# Patient Record
Sex: Female | Born: 1965 | Race: White | Hispanic: No | Smoking: Never smoker
Health system: Southern US, Community
[De-identification: ages and names within clinical notes are randomized; demographics above are authoritative.]

## PROBLEM LIST (undated history)

## (undated) DIAGNOSIS — M5416 Radiculopathy, lumbar region: Secondary | ICD-10-CM

## (undated) DIAGNOSIS — M545 Low back pain, unspecified: Secondary | ICD-10-CM

## (undated) DIAGNOSIS — G43909 Migraine, unspecified, not intractable, without status migrainosus: Secondary | ICD-10-CM

## (undated) DIAGNOSIS — I7771 Dissection of carotid artery: Secondary | ICD-10-CM

## (undated) DIAGNOSIS — M5412 Radiculopathy, cervical region: Secondary | ICD-10-CM

## (undated) DIAGNOSIS — M542 Cervicalgia: Secondary | ICD-10-CM

## (undated) DIAGNOSIS — I10 Essential (primary) hypertension: Secondary | ICD-10-CM

## (undated) HISTORY — PX: CERVICAL FUSION: SHX112

## (undated) HISTORY — PX: TOTAL ABDOMINAL HYSTERECTOMY: SHX209

## (undated) HISTORY — DX: Essential (primary) hypertension: I10

## (undated) HISTORY — PX: BACK SURGERY: SHX140

## (undated) HISTORY — DX: Dissection of carotid artery: I77.71

---

## 2000-11-30 ENCOUNTER — Encounter: Payer: Self-pay | Admitting: Obstetrics and Gynecology

## 2000-11-30 ENCOUNTER — Ambulatory Visit (HOSPITAL_COMMUNITY): Admission: RE | Admit: 2000-11-30 | Discharge: 2000-11-30 | Payer: Self-pay | Admitting: Obstetrics and Gynecology

## 2001-01-14 ENCOUNTER — Inpatient Hospital Stay (HOSPITAL_COMMUNITY): Admission: RE | Admit: 2001-01-14 | Discharge: 2001-01-16 | Payer: Self-pay | Admitting: Obstetrics and Gynecology

## 2001-01-14 ENCOUNTER — Encounter (INDEPENDENT_AMBULATORY_CARE_PROVIDER_SITE_OTHER): Payer: Self-pay

## 2001-06-02 ENCOUNTER — Ambulatory Visit (HOSPITAL_COMMUNITY): Admission: RE | Admit: 2001-06-02 | Discharge: 2001-06-02 | Payer: Self-pay | Admitting: Unknown Physician Specialty

## 2001-06-02 ENCOUNTER — Encounter: Payer: Self-pay | Admitting: Unknown Physician Specialty

## 2002-10-28 ENCOUNTER — Encounter: Payer: Self-pay | Admitting: Unknown Physician Specialty

## 2002-10-28 ENCOUNTER — Ambulatory Visit (HOSPITAL_COMMUNITY): Admission: RE | Admit: 2002-10-28 | Discharge: 2002-10-28 | Payer: Self-pay | Admitting: Unknown Physician Specialty

## 2003-10-16 ENCOUNTER — Ambulatory Visit (HOSPITAL_COMMUNITY): Admission: RE | Admit: 2003-10-16 | Discharge: 2003-10-16 | Payer: Self-pay | Admitting: Unknown Physician Specialty

## 2003-11-20 ENCOUNTER — Ambulatory Visit (HOSPITAL_COMMUNITY): Admission: RE | Admit: 2003-11-20 | Discharge: 2003-11-20 | Payer: Self-pay | Admitting: Unknown Physician Specialty

## 2003-12-29 ENCOUNTER — Ambulatory Visit (HOSPITAL_COMMUNITY): Admission: RE | Admit: 2003-12-29 | Discharge: 2003-12-30 | Payer: Self-pay | Admitting: Neurosurgery

## 2007-07-19 ENCOUNTER — Ambulatory Visit (HOSPITAL_COMMUNITY): Admission: AD | Admit: 2007-07-19 | Discharge: 2007-07-20 | Payer: Self-pay | Admitting: Neurological Surgery

## 2007-07-24 ENCOUNTER — Emergency Department (HOSPITAL_COMMUNITY): Admission: EM | Admit: 2007-07-24 | Discharge: 2007-07-24 | Payer: Self-pay | Admitting: Emergency Medicine

## 2011-01-24 NOTE — H&P (Signed)
Dawn, Aguilar           ACCOUNT NO.:  [id number]   MEDICAL RECORD NO.:  [id number]          PATIENT TYPE:  AMB   LOCATION:  SDS                          FACILITY:  [facility]   PHYSICIAN:  Stefani Dama, M.D.  DATE OF BIRTH:  06/05/1966   DATE OF ADMISSION:  07/19/2007  DATE OF DISCHARGE:                              HISTORY & PHYSICAL   ADMITTING DIAGNOSIS:  Herniated nucleus pulposus, L3-L4, extraforaminal,  left, with left L3 radiculopathy.   HISTORY OF PRESENT ILLNESS:  [name] is a 45 year old right-  handed white female who has had previous disk disease and has had  diskectomy in 1996 for a large extruded fragment disk at L5-S1 on the  left side.  She has also had cervical disk disease at C5-6 and C6-7 and  underwent surgical decompression in 2005.  Her general health otherwise  has been well, but for the past 3 weeks' time, she started to develop  some back pain and then this past week, she developed severe and  unrelenting left lower extremity pain radiating from the buttock into  the anterior side.  She has not been able to get comfortable and she was  started on some oral steroids about 3 days ago and this only seems to  have not made pain any better; things were worse.  She had an MRI last  night and this demonstrates the presence of an extraforaminal fragment  of disk under the L3 nerve root and the foramen on the left side at L3-  L4.  She is now being admitted to undergo surgical extirpation of the  disk, as she has been in severe pain.   PAST MEDICAL HISTORY:  Her general health has been good.  She reports no  medical problems.  The only other surgery has been hysterectomy.  Her  systems review is notable for the back pain and the leg pain.  On a 14-  point review sheet, no other significant findings are noted.   SOCIAL HISTORY:  She is working.  She functions independently.   FAMILY HISTORY:  Her mother and father are in good health.   PERSONAL  HISTORY:  She does not smoke.  She does not use alcohol.  Height and weight have been stable, 5 feet 110 pounds.   PHYSICAL EXAMINATION:  GENERAL:  She is alert, oriented and cooperative  individual in no overt distress.  BACK:  Range of motion of her back reveals that she can flex forward  only 30 degrees.  She extends 10 degrees.  She has severe unrelenting  pain to palpation and percussion in her back and in the buttock on that  left side.  Motor strength is good in the iliopsoas, quad, tibialis  anterior and the gastrocs.  Deep tendon reflexes are 2+ in both patellae  and 2+ in the Achilles.  Babinski's are downgoing.  Straight leg raising  is markedly positive on the left side at 15 degrees, negative on the  right side at 60 degrees.  Patrick's maneuver is negative bilaterally.  Cranial nerve examination reveals that the pupils are 3 mm and briskly  reactive to light and accommodation.  Extraocular movements are full and  the face is symmetric to grimace.  Tongue and uvula are in the midline.  Sclerae and conjunctivae are clear.  LUNGS:  Clear to auscultation.  HEART:  Regular rate and rhythm.  No murmurs are heard.  ABDOMEN:  Soft.  Bowel sounds are positive.  No masses are palpable.  EXTREMITIES:  Reveal no cyanosis, clubbing or edema.   IMPRESSION:  The patient has evidence of a herniated nucleus pulposus at  the L3-L4 level in the foramen on the left side.  She is now being  admitted to undergo surgical extirpation.      Stefani Dama, M.D.  Electronically Signed     HJE/MEDQ  D:  07/19/2007  T:  07/21/2007  Job:  [id number]

## 2011-01-24 NOTE — Op Note (Signed)
[name], [name]           ACCOUNT NO.:  [id number]   MEDICAL RECORD NO.:  [id number]          PATIENT TYPE:  OIB   LOCATION:  3172                         FACILITY:  [facility]   PHYSICIAN:  Stefani Dama, M.D.  DATE OF BIRTH:  23-Nov-1965   DATE OF PROCEDURE:  07/19/2007  DATE OF DISCHARGE:                               OPERATIVE REPORT   PREOPERATIVE DIAGNOSIS:  Herniated nucleus pulposus L3-L4 extraforaminal  with left L3 radiculopathy.   POSTOPERATIVE DIAGNOSIS:  Herniated nucleus pulposus L3-L4  extraforaminal with left L3 radiculopathy.   PROCEDURE:  Left L3-L4 METRx microdiscectomy with extraforaminal  exploration, decompression of the nerve root, operating microscope,  microdissection technique.   SURGEON:  Stefani Dama, M.D.   FIRST ASSISTANT:  Hilda Lias, M.D.   ANESTHESIA:  General endotracheal.   INDICATIONS:  [name] is a 45 year old right handed individual  who has had the severe onset of back and left lower extremity pain.  She  was found to have an extruded fragment of disc in the extraforaminal  space at L3-L4 compressing the L3 nerve root.  After careful  consideration of her options, noting that she was refractory to  conservative treatment with strong pain medication, she was advised  regarding surgical decompression of the nerve root.   OPERATIVE PROCEDURE:  The patient was brought to the operating room and  placed on the table in a supine position. After the smooth induction of  general endotracheal anesthesia, she was turned prone, bony prominences  being appropriately padded and protected. The back was prepped with  alcohol and DuraPrep and draped in a sterile fashion and the  fluoroscopic guidance was used to localize the L3-L4 extraforaminal  space on the left side. A K-wire was passed to the laminar arch of L3  and then dissection was carried down with a series of dilators being  passed over the K-wire enlarging this area and  removing soft tissues  from the region of the pars.  A 4 cm deep x 18 mm wide endoscopic  cannula was then fixed to the operating table with a clamp.  The  microscope was draped and brought onto the field over this area.  The  area of the pars was identified and the lateral tissues were resected  using monopolar cautery and then a portion of the pars was resected  using a high speed drill.  The resection was taken down to the supra-  articular process of L4 on the left side.  The space was then dissected  removing the intertransverse ligament.  The underlying fatty tissue was  identified below this and the nerve was identified.  It was noted to  feel tight and splayed out dorsally. Inferior to the nerve, the area was  dissected and it was felt that some of the disc material would be  present in this area.  No disc material could be found.  The dorsal  surface of the nerve was then explored and in the lateral aspect, there  was noted be a significant mass underneath the nerve.  The nerve was  dissected superiorly and some epidural veins  in this area were  cauterized and divided and this allowed for some slight retraction of  the nerve inferiorly and the mass underneath it was found, incised in a  vertical fashion, and underneath it a large disc fragment presented  itself.  This was removed and almost immediately there was good  decompression of the nerve root. Further exploration with a series of  nerve hooks underneath the nerve yielded a second large fragment of disc  material. Beyond this, there was venous bleeding which was tamponaded  with some pledgets of Gelfoam soaked in thrombin, some dilated epidural  veins that presented themselves were cauterized.  Once hemostasis was  achieved, further exploration yielded no other disc fragments.  Exploration of the nerve now yielded that the nerve was well  decompressed in its travel through the foramen. With good decompression  being  obtained and hemostasis being obtained, the wound was irrigated  copiously with antibiotic irrigating solution.  The endoscopic cannula  was removed.  The microscope was removed.  The fascia was closed with a  singular 3-0 Vicryl suture, 3-0 Vicryl was used in the subcuticular  tissues, and Dermabond was placed on the skin.  The patient tolerated  the procedure well and was returned to the recovery room in stable  condition.      Stefani Dama, M.D.  Electronically Signed     HJE/MEDQ  D:  07/19/2007  T:  07/20/2007  Job:  [id number]

## 2011-01-27 NOTE — Discharge Summary (Signed)
[facility]  Patient:    [name], [name]                MRN: [id number] Adm. Date:  [id number] Disc. Date: [id number] Attending:  Lendon Colonel                           Discharge Summary  ADMITTING DIAGNOSIS:  Severe dysmenorrhea and menorrhagia with anemia, adenomyosis.  DISCHARGE DIAGNOSIS:  Severe dysmenorrhea and menorrhagia with anemia, adenomyosis.  OPERATION PERFORMED:  Total abdominal hysterectomy.  HISTORY OF PRESENT ILLNESS:  Ms. Kalbfleisch is a 45 year old para 2 female status post two C sections with heavy painful periods, slightly enlarged symmetrical uterus with an MRI diagnosis of adenomyosis.  Hemoglobin on admission was 10.2, hematocrit was 32.  HOSPITAL COURSE:  She was admitted to the hospital and underwent an uneventful hysterectomy.  Her postoperative course was uncomplicated.  Pathologically, adenomyosis was confirmed by Dr. Vassie Loll.  Her postoperative course was uncomplicated.  She remained afebrile without complaints, is now ready for discharge.  Instructions were given to the patient.  She was asked to return to the office in six weeks.  She will come by the office on Friday to have her clips removed.  CONDITION ON DISCHARGE:  Improved. DD:  01/16/01 TD:  01/16/01 Job: [id number]

## 2011-01-27 NOTE — H&P (Signed)
[facility]  Patient:    [name], [name]                    MRN: [id number] Adm. Date:  01/14/01 Attending:  Katherine Roan, M.D.                         History and Physical  CHIEF COMPLAINT:  Heavy painful periods.  HISTORY OF PRESENT ILLNESS:  [name] is a 45 year old gravida 2, para 2 female who continues to complain of heavy painful periods.  A trial of oral contraceptives did not work and an MRI revealed adenomyosis of the uterus. She is status post two C-sections.  She has had a tubal ligation at the time of her last section and she has had lumbar disk disease.  She currently takes no medications, has no known allergies and received no blood transfusion.  REVIEW OF SYSTEMS:  HEENT:  She wears glasses for reading, with no decrease in visual or auditory acuity.  No dizziness.  No frequent headaches.  HEART:  No history of MVP.  No rheumatic fever.  No chest pain or hypertension.  LUNGS: No chronic cough.  No asthma.  No shortness of breath.  GU:  She denies stress urinary incontinence.  No frequency.  She has no history of UTIs.  GI:  No bowel habit change.  No anorexia.  No weight loss or gain.  MUSCLES, BONES AND JOINTS:  Negative.  No fractures or arthritis.  SOCIAL HISTORY:  She is a Counsellor, works regularly.  FAMILY HISTORY:  Mother and father both living and well.  She has two sisters who are living and well.  She has a paternal aunt who has a malignancy in her brain and a maternal uncle with lesion in his liver.  There is a strong family history of heart disease in a paternal grandfather.  Her mother has mild hypertension, as well as her sister.  Her father has had mini-strokes.  No diabetes in the family.  PHYSICAL EXAMINATION:  VITAL SIGNS:  Examination reveals a weight of 122 pounds, a blood pressure of 120/60.  HEENT:  Examination of ears, nose and throat is unremarkable.  The oropharynx is not injected.  NECK:  Supple.   Thyroid is not enlarged.  Carotid pulses are equal without bruits.  Trachea is in the midline.  No adenopathy appreciated.  BREASTS:  No masses or tenderness.  Axillae free from adenopathy.  LUNGS:  Clear to P&A.  HEART:  Normal sinus rhythm.  No murmurs.  ABDOMEN:  Soft and flat.  Liver, spleen and kidneys are not palpated.  Bowel sounds are normal.  No tenderness noted.  No rebound or guarding.  There is a transverse incision from her previous C-section that is well-healed.  PELVIC:  Examination reveals a normal vulva and vagina.  Cervix is clean. Uterus is anterior, slightly enlarged, with no adnexal masses.  Rectovaginal confirms.  Her Paps are normal.  EXTREMITIES:  Good range of motion, equal pulses and reflexes.  NEUROLOGIC:  Exam revealed cranial nerves are intact.  The patient is oriented to time, space and recent events.  IMPRESSION:  Persistent heavy painful periods despite conservative therapy, MRI evidence of adenomyosis.  PLAN:  Total abdominal hysterectomy.  Risks and benefits including blood loss, bladder/bowel damage and vascular injuries have been discussed with patient. DD:  01/11/01 TD:  01/13/01 Job: [id number]

## 2011-01-27 NOTE — Op Note (Signed)
[facility]  Patient:    [name], [name]                MRN: [id number] Proc. Date: 01/14/01 Adm. Date:  [id number] Attending:  Lendon Colonel                           Operative Report  PREOPERATIVE DIAGNOSES:  Anemia, dysmenorrhea, and menorrhagia unresponsive. Magnetic resonance imaging evidence of adenomyosis.  POSTOPERATIVE DIAGNOSES:  Anemia, dysmenorrhea, and menorrhagia unresponsive. Magnetic resonance imaging evidence of adenomyosis.  OPERATION:  Total abdominal hysterectomy.  DESCRIPTION OF PROCEDURE:  The patient was placed in lithotomy position and prepped and draped in the usual fashion.  The old transverse incision was entered with sharp dissection.  Hemostasis was accomplished with the Bovie. The peritoneum was entered vertically, and exploration of the upper abdomen revealed two normal kidneys.  The liver was smooth, and the gallbladder was soft and compressible.  No stones were felt.  No adenopathy was noted.  There was minimal peritoneal fluid.  Both ovaries were normal.  The uterus was grasped with Kelly clamps, the uteroovarian anastomoses transected bilaterally along with the round ligaments.  Uterine vessels were skeletonized; a bladder flap was created and pushed off the lower segment.  Cardinals and ureterosacral complex were then clamped and ligated and identified for later use.  The angles of the vagina were entered and the specimens removed from the operative field.  Vagina was then closed with horizontal mattress sutures of 0 chromic and figure-of-eight sutures of 0 Vicryl.  The ureterosacral complex and cardinals were then sutured into the vagina with interrupted sutures of 0 Vicryl.  The vaginal vault suspension was satisfactory.  Both ovaries were normal, and the round ligaments were normal.  Reperitonealization was accomplished with 3-0 Vicryl.  Hemostasis was secure.  The pelvis was washed with copious  amounts of saline.  The parietal peritoneum was closed with a 2-0 PDS.  The fascia was closed with a 2-0 PDS and interrupted 0 Vicryl. Hemostasis was secure.  The subcutaneous was irrigated, and then the skin was closed with clips.  The incision was infiltrated with 20 cc of 0.5% Marcaine with epinephrine.  Ms. Michl tolerated the procedure well.  Sent to the recovery room in good condition. DD:  01/14/01 TD:  01/12/01 Job: [id number]

## 2011-01-27 NOTE — H&P (Signed)
NAME:  [name], [name]                     ACCOUNT NO.:  [id number]   MEDICAL RECORD NO.:  [id number]                   PATIENT TYPE:  OIB   LOCATION:  3011                                 FACILITY:  [facility]   PHYSICIAN:  Hilda Lias, M.D.                DATE OF BIRTH:  1965-10-22   DATE OF ADMISSION:  12/29/2003  DATE OF DISCHARGE:                                HISTORY & PHYSICAL   HISTORY OF PRESENT ILLNESS:  Ms. Gatling is a lady who was seen by me  because of neck pain with radiation to the upper extremity.  In the past, in  1996, she underwent L5-S1 diskectomy by me.  This time, she has been  complaining of neck pain since December of 2004 that goes to the left  shoulder, and to the left upper extremity.  The patient, although is able to  work, nevertheless is quite miserable, and she apparently can not sleep.  She also feels that she is getting weak.  Also, she says the right arm is  getting swollen in relation to the left one.  The patient has no problems  with bladder or bowel.   An MRI was obtained.  Because of the findings, she wants to proceed with  surgery.   PAST MEDICAL HISTORY:  Lumbar diskectomy, hysterectomy, cesarean section.   ALLERGIES:  She is not allergic to any medications.   SOCIAL HISTORY:  Negative.   FAMILY HISTORY:  Mother has degenerative joint disease.   REVIEW OF SYSTEMS:  Positive for balance disturbance, weakness and neck  pain.   PHYSICAL EXAMINATION:  GENERAL:  The patient came to my office and was quite  uncomfortable.  She had difficulty with the pain in the right upper  extremity.  HEENT:  Head, nose and throat normal.  NECK:  She is able to flex by extension and lateralization because of  discomfort.  LUNGS:  Clear.  HEART:  Sounds normal.  ABDOMEN:  Normal.  EXTREMITIES:  Normal pulses.  NEUROLOGIC:  Mental status is normal.  Cranial nerves normal.  Strength:  I  can bring both biceps easily, the right triceps is 0/5.  She  has a weakening  of the first extensor.  She has atrophy of the right triceps.  Reflexes are  symmetrical with absence of the right triceps.  She has hypotonia of the  right pectoralis major as well as the right triceps.  Coordination is  normal.  BACK:  The cervical spine shows some spondylosis from C3 down to C5/6.  The  MRI shows she has a severe case of spondylosis of L5-L6, and at the level of  L6-L7, she has a large herniated disk, central and to the right.   CLINICAL IMPRESSION:  C5-C6 spondylosis with a C6-C7 herniated disk.   RECOMMENDATION:  1. The patient is going to proceed with anterior cervical diskectomy,     decompression of the spinal cord at the  level     of C5-6, and C6-7.  2. She knows about the risks of infection, CSF leak, damage to the     __________  , damage to the vertebral artery, damage to the carotid,     failure of the graft, need for further surgery.  __________  .                                                Hilda Lias, M.D.    EB/MEDQ  D:  12/29/2003  T:  12/30/2003  Job:  [id number]

## 2011-01-27 NOTE — Op Note (Signed)
[name], AGREDANO                     ACCOUNT NO.:  [id number]   MEDICAL RECORD NO.:  [id number]                   PATIENT TYPE:  OIB   LOCATION:  3011                                 FACILITY:  [facility]   PHYSICIAN:  Hilda Lias, M.D.                DATE OF BIRTH:  March 20, 1966   DATE OF PROCEDURE:  12/29/2003  DATE OF DISCHARGE:                                 OPERATIVE REPORT   PREOPERATIVE DIAGNOSIS:  C5-C6 and C6-C7 spondylosis with herniated disc and  weakness of the right triceps with atrophy.   POSTOPERATIVE DIAGNOSIS:  C5-C6 and C6-C7 spondylosis with herniated disc  and weakness of the right triceps with atrophy.   PROCEDURE:  C5-C6 and C6-C7 decompression of the spinal cord, bilateral  foraminotomy, decompression of the C6 and C7 nerve roots, intervertebral  fusion with allograft placed from C5 to C7, microscope.   SURGEON:  Hilda Lias, M.D.   ASSISTANT:  Danae Orleans. Venetia Maxon, M.D.   CLINICAL HISTORY:  The patient was admitted because of neck pain with  radiation to both upper extremities, worse on the left, with hypertrophy of  the right triceps.  X-ray showed spondylosis and herniated disc at the level  of C5-C6 and C6-C7.  Surgery was advised.  The risks were explained during  the history and physical.   PROCEDURE:  The patient was taken to the OR and after intubation, the left  side of the neck was prepped with Betadine.  A transverse incision was made  through the skin and platysma down to the cervical spine.  X-ray showed  that, indeed, we were at the level of 5-6.  The anterior ligament was opened  at 5-6 and 6-7.  We brought in the microscope, and we did discectomy at  those levels.  We found at 6-7, the patient has 3-4 fragments of disc going  to the left side.  At the level of 5-6, we found mostly spondylosis with  chronic compression of the C6 nerve root.  Having good decompression, the  endplates were drilled.  The allograft 7 mm in height was  inserted followed  by a plate using six screws.  The lateral C-spine showed good position of  the bone graft, screws, and plate.  From then on, the area was irrigated,  hemostasis was done with bipolar, and the wound was closed with Vicryl and  Steri-Strips.  December 28, 2003                                               Hilda Lias, M.D.    EB/MEDQ  D:  12/29/2003  T:  12/29/2003  Job:  [id number]

## 2011-06-20 LAB — CBC
HCT: 41.4
MCHC: 34.2
Platelets: 258
RDW: 12.1

## 2015-06-17 ENCOUNTER — Encounter (HOSPITAL_COMMUNITY): Payer: Self-pay | Admitting: *Deleted

## 2015-06-17 ENCOUNTER — Emergency Department (HOSPITAL_COMMUNITY): Payer: BLUE CROSS/BLUE SHIELD

## 2015-06-17 ENCOUNTER — Emergency Department (HOSPITAL_COMMUNITY)
Admission: EM | Admit: 2015-06-17 | Discharge: 2015-06-17 | Disposition: A | Discharge status: Home / Self Care | Payer: BLUE CROSS/BLUE SHIELD | Attending: Emergency Medicine | Admitting: Emergency Medicine

## 2015-06-17 DIAGNOSIS — R51 Headache: Secondary | ICD-10-CM | POA: Diagnosis present

## 2015-06-17 DIAGNOSIS — G43909 Migraine, unspecified, not intractable, without status migrainosus: Secondary | ICD-10-CM | POA: Insufficient documentation

## 2015-06-17 DIAGNOSIS — R519 Headache, unspecified: Secondary | ICD-10-CM

## 2015-06-17 DIAGNOSIS — H5702 Anisocoria: Secondary | ICD-10-CM

## 2015-06-17 DIAGNOSIS — H5710 Ocular pain, unspecified eye: Secondary | ICD-10-CM | POA: Diagnosis not present

## 2015-06-17 DIAGNOSIS — H9209 Otalgia, unspecified ear: Secondary | ICD-10-CM | POA: Insufficient documentation

## 2015-06-17 LAB — COMPREHENSIVE METABOLIC PANEL
ALT: 13 U/L — ABNORMAL LOW (ref 14–54)
ANION GAP: 9 (ref 5–15)
AST: 20 U/L (ref 15–41)
Albumin: 4.2 g/dL (ref 3.5–5.0)
Alkaline Phosphatase: 55 U/L (ref 38–126)
BUN: 13 mg/dL (ref 6–20)
CHLORIDE: 103 mmol/L (ref 101–111)
CO2: 24 mmol/L (ref 22–32)
CREATININE: 0.65 mg/dL (ref 0.44–1.00)
Calcium: 9.1 mg/dL (ref 8.9–10.3)
Glucose, Bld: 95 mg/dL (ref 65–99)
POTASSIUM: 4 mmol/L (ref 3.5–5.1)
SODIUM: 136 mmol/L (ref 135–145)
Total Bilirubin: 0.9 mg/dL (ref 0.3–1.2)
Total Protein: 7.2 g/dL (ref 6.5–8.1)

## 2015-06-17 LAB — CBC
HEMATOCRIT: 45.1 % (ref 36.0–46.0)
Hemoglobin: 15.4 g/dL — ABNORMAL HIGH (ref 12.0–15.0)
MCH: 33.3 pg (ref 26.0–34.0)
MCHC: 34.1 g/dL (ref 30.0–36.0)
MCV: 97.6 fL (ref 78.0–100.0)
PLATELETS: 230 10*3/uL (ref 150–400)
RBC: 4.62 MIL/uL (ref 3.87–5.11)
RDW: 11.8 % (ref 11.5–15.5)
WBC: 5.6 10*3/uL (ref 4.0–10.5)

## 2015-06-17 LAB — I-STAT CHEM 8, ED
BUN: 16 mg/dL (ref 6–20)
CALCIUM ION: 1.19 mmol/L (ref 1.12–1.23)
Chloride: 101 mmol/L (ref 101–111)
Creatinine, Ser: 0.7 mg/dL (ref 0.44–1.00)
Glucose, Bld: 94 mg/dL (ref 65–99)
HEMATOCRIT: 48 % — AB (ref 36.0–46.0)
HEMOGLOBIN: 16.3 g/dL — AB (ref 12.0–15.0)
Potassium: 3.9 mmol/L (ref 3.5–5.1)
SODIUM: 138 mmol/L (ref 135–145)
TCO2: 23 mmol/L (ref 0–100)

## 2015-06-17 LAB — DIFFERENTIAL
BASOS PCT: 0 %
Basophils Absolute: 0 10*3/uL (ref 0.0–0.1)
EOS ABS: 0.1 10*3/uL (ref 0.0–0.7)
Eosinophils Relative: 2 %
Lymphocytes Relative: 25 %
Lymphs Abs: 1.4 10*3/uL (ref 0.7–4.0)
MONO ABS: 0.4 10*3/uL (ref 0.1–1.0)
MONOS PCT: 8 %
NEUTROS ABS: 3.7 10*3/uL (ref 1.7–7.7)
Neutrophils Relative %: 65 %

## 2015-06-17 LAB — CBG MONITORING, ED: GLUCOSE-CAPILLARY: 88 mg/dL (ref 65–99)

## 2015-06-17 LAB — I-STAT TROPONIN, ED: TROPONIN I, POC: 0.01 ng/mL (ref 0.00–0.08)

## 2015-06-17 LAB — APTT: APTT: 30 s (ref 24–37)

## 2015-06-17 LAB — PROTIME-INR
INR: 1.17 (ref 0.00–1.49)
PROTHROMBIN TIME: 15 s (ref 11.6–15.2)

## 2015-06-17 MED ORDER — TETRACAINE HCL 0.5 % OP SOLN
2.0000 [drp] | Freq: Once | OPHTHALMIC | Status: AC
  Administered 2015-06-17: 2 [drp] via OPHTHALMIC
  Filled 2015-06-17: qty 2

## 2015-06-17 MED ORDER — DIPHENHYDRAMINE HCL 50 MG/ML IJ SOLN
25.0000 mg | Freq: Once | INTRAMUSCULAR | Status: AC
  Administered 2015-06-17: 25 mg via INTRAVENOUS
  Filled 2015-06-17: qty 1

## 2015-06-17 MED ORDER — METOCLOPRAMIDE HCL 5 MG/ML IJ SOLN
10.0000 mg | Freq: Once | INTRAMUSCULAR | Status: AC
  Administered 2015-06-17: 10 mg via INTRAVENOUS
  Filled 2015-06-17: qty 2

## 2015-06-17 NOTE — ED Notes (Signed)
PA to see and assess patient before RN assessment. 

## 2015-06-17 NOTE — ED Notes (Signed)
CHECKED CBG 88 RN SABRINA INFORMED

## 2015-06-17 NOTE — ED Notes (Signed)
MD at bedside. 

## 2015-06-17 NOTE — ED Provider Notes (Signed)
CSN: [id number]     Arrival date & time 06/17/15  1712 History   First MD Initiated Contact with Patient 06/17/15 1809     Chief Complaint  Patient presents with  . Stroke Symptoms  . Headache  . Jaw Pain     (Consider location/radiation/quality/duration/timing/severity/associated sxs/prior Treatment) Patient is a 49 y.o. female presenting with headaches.  Headache Pain location:  L parietal (L eye) Quality:  Dull Radiates to:  Does not radiate Severity currently:  Unable to specify Duration:  2 days Timing:  Constant Progression:  Unchanged Chronicity:  New Similar to prior headaches: no   Context: not activity and not exposure to bright light   Relieved by:  Nothing Worsened by:  Activity and light Associated symptoms: ear pain, eye pain and sinus pressure   Associated symptoms: no abdominal pain, no nausea and no vomiting     History reviewed. No pertinent past medical history. Past Surgical History  Procedure Laterality Date  . Back surgery    . Cervical fusion     No family history on file. Social History  Substance Use Topics  . Smoking status: Never Smoker   . Smokeless tobacco: None  . Alcohol Use: No   OB History    No data available     Review of Systems  HENT: Positive for ear pain and sinus pressure.   Eyes: Positive for pain.  Gastrointestinal: Negative for nausea, vomiting and abdominal pain.  Neurological: Positive for headaches.  All other systems reviewed and are negative.     Allergies  Review of patient's allergies indicates no known allergies.  Home Medications   Prior to Admission medications   Medication Sig Start Date End Date Taking? Authorizing Provider  ibuprofen (ADVIL,MOTRIN) 200 MG tablet Take 200 mg by mouth every 6 (six) hours as needed for moderate pain.   Yes Historical Provider, MD  SUMAtriptan (IMITREX) 50 MG tablet Take 50 mg by mouth every 2 (two) hours as needed for migraine. May repeat in 2 hours if headache  persists or recurs.   Yes Historical Provider, MD   BP 111/75 mmHg  Pulse 94  Temp(Src) 98.1 F (36.7 C) (Oral)  Resp 16  SpO2 99% Physical Exam  Constitutional: She is oriented to person, place, and time. She appears well-developed and well-nourished.  HENT:  Head: Normocephalic and atraumatic.  Right Ear: External ear normal.  Left Ear: External ear normal.  Eyes: Conjunctivae and EOM are normal. Pupils are equal, round, and reactive to light.  Neck: Normal range of motion. Neck supple.  Cardiovascular: Normal rate, regular rhythm, normal heart sounds and intact distal pulses.   Pulmonary/Chest: Effort normal and breath sounds normal.  Abdominal: Soft. Bowel sounds are normal. There is no tenderness.  Musculoskeletal: Normal range of motion.  Neurological: She is alert and oriented to person, place, and time. She has normal strength. No sensory deficit.  Prominent anisocoria, R pupil >L, bil reactive  Skin: Skin is warm and dry.  Vitals reviewed.   ED Course  Procedures (including critical care time) Labs Review Labs Reviewed  CBC - Abnormal; Notable for the following:    Hemoglobin 15.4 (*)    All other components within normal limits  COMPREHENSIVE METABOLIC PANEL - Abnormal; Notable for the following:    ALT 13 (*)    All other components within normal limits  I-STAT CHEM 8, ED - Abnormal; Notable for the following:    Hemoglobin 16.3 (*)    HCT 48.0 (*)  All other components within normal limits  PROTIME-INR  APTT  DIFFERENTIAL  I-STAT TROPOININ, ED  CBG MONITORING, ED    Imaging Review Ct Head Wo Contrast  06/17/2015   CLINICAL DATA:  Left-sided headache, numbness  EXAM: CT HEAD WITHOUT CONTRAST  TECHNIQUE: Contiguous axial images were obtained from the base of the skull through the vertex without intravenous contrast.  COMPARISON:  None.  FINDINGS: There is no evidence of mass effect, midline shift or extra-axial fluid collections. There is no evidence of a  space-occupying lesion or intracranial hemorrhage. There is no evidence of a cortical-based area of acute infarction. Mild periventricular white matter low attenuation likely secondary to microvascular disease.  The ventricles and sulci are appropriate for the patient's age. The basal cisterns are patent.  Visualized portions of the orbits are unremarkable. The visualized portions of the paranasal sinuses and mastoid air cells are unremarkable.  The osseous structures are unremarkable.  IMPRESSION: No acute intracranial pathology.   Electronically Signed   By: Elige Ko   On: 06/17/2015 19:11   Mr Angiogram Head Wo Contrast  06/17/2015   CLINICAL DATA:  Anisocoria.  Headache.  EXAM: MRI HEAD WITHOUT CONTRAST  MRA HEAD WITHOUT CONTRAST  TECHNIQUE: Multiplanar, multiecho pulse sequences of the brain and surrounding structures were obtained without intravenous contrast. Angiographic images of the head were obtained using MRA technique without contrast.  COMPARISON:  CT head 06/17/2015  FINDINGS: MRI HEAD FINDINGS  Ventricle size is normal.  Cerebral volume is normal.  Negative for acute infarct. Minimal chronic microvascular ischemic changes in the white matter.  Negative for hemorrhage or mass  Pituitary normal in size. Cervical medullary junction is normal. Normal orbit. Paranasal sinuses are clear.  MRA HEAD FINDINGS  Both vertebral arteries patent to the basilar. PICA patent bilaterally. Basilar widely patent. Superior cerebellar and posterior cerebral arteries are normal  Internal carotid artery is normal bilaterally. Anterior and middle cerebral arteries are normal bilaterally. No significant cranial stenosis  Negative for cerebral aneurysm.  IMPRESSION: No acute intracranial abnormality.  Normal for age.  Negative MRA head.   Electronically Signed   By: Marlan Palau M.D.   On: 06/17/2015 20:44   Mr Brain Wo Contrast  06/17/2015   CLINICAL DATA:  Anisocoria.  Headache.  EXAM: MRI HEAD WITHOUT CONTRAST   MRA HEAD WITHOUT CONTRAST  TECHNIQUE: Multiplanar, multiecho pulse sequences of the brain and surrounding structures were obtained without intravenous contrast. Angiographic images of the head were obtained using MRA technique without contrast.  COMPARISON:  CT head 06/17/2015  FINDINGS: MRI HEAD FINDINGS  Ventricle size is normal.  Cerebral volume is normal.  Negative for acute infarct. Minimal chronic microvascular ischemic changes in the white matter.  Negative for hemorrhage or mass  Pituitary normal in size. Cervical medullary junction is normal. Normal orbit. Paranasal sinuses are clear.  MRA HEAD FINDINGS  Both vertebral arteries patent to the basilar. PICA patent bilaterally. Basilar widely patent. Superior cerebellar and posterior cerebral arteries are normal  Internal carotid artery is normal bilaterally. Anterior and middle cerebral arteries are normal bilaterally. No significant cranial stenosis  Negative for cerebral aneurysm.  IMPRESSION: No acute intracranial abnormality.  Normal for age.  Negative MRA head.   Electronically Signed   By: Marlan Palau M.D.   On: 06/17/2015 20:44   I have personally reviewed and evaluated these images and lab results as part of my medical decision-making.   EKG Interpretation   Date/Time:  Thursday June 17 2015  17:43:52 EDT Ventricular Rate:  103 PR Interval:  134 QRS Duration: 70 QT Interval:  346 QTC Calculation: 453 R Axis:   59 Text Interpretation:  Sinus tachycardia Otherwise normal ECG No old  tracing to compare Confirmed by Mirian Mo [phone]) on 06/17/2015  6:12:28 PM      MDM   Final diagnoses:  Anisocoria  Acute nonintractable headache, unspecified headache type    49 y.o. female with pertinent PMH of migraine presents with ha as above.  Normally the patient has headaches on the right side, however states this one started in the left side approximately 2 days ago. She does have intermittent problems with blurred vision,  however states this is worse in the left. She states that she does not know if her pupils are mineral regular before, however family member at bedside is never noticed this. On arrival vital signs and physical exam as above. Patient has anisocoria, however both pupils are reactive.  States she has a ho identical prior event.    Dorna Bloom today unremarkable, and I spoke with neurology who recommended MRA.  This was also unremarkable.  IOP 15 OD, 13 OS.  No visual problems currently.  DC home to fu with ophthalmology and neuro.  I have reviewed all laboratory and imaging studies if ordered as above  1. Anisocoria   2. Acute nonintractable headache, unspecified headache type         Mirian Mo, MD 06/17/15 2321

## 2015-06-17 NOTE — Discharge Instructions (Signed)

## 2015-06-17 NOTE — ED Notes (Signed)
Pt states started having left eye watering on Tuesday and took her imitrex for migraines where she was having pain on left side posterior head.  Pt usually has them on her right side. Pt had some eye dropping.  Pt is having a "throbbing to left eye".  Pt states headache since Tuesday continuous.  Pt has irregular pupils, not sure if normal for her.  Pt has a "swelling" pain to left ear. No facial or extremity deficits.  Pt states feels like vision was different today, blurry.

## 2015-06-21 ENCOUNTER — Other Ambulatory Visit: Payer: Self-pay | Admitting: Ophthalmology

## 2015-06-21 DIAGNOSIS — G902 Horner's syndrome: Secondary | ICD-10-CM

## 2015-06-25 ENCOUNTER — Ambulatory Visit
Admission: RE | Admit: 2015-06-25 | Discharge: 2015-06-25 | Disposition: A | Discharge status: Home / Self Care | Payer: BLUE CROSS/BLUE SHIELD | Source: Ambulatory Visit | Attending: Ophthalmology | Admitting: Ophthalmology

## 2015-06-25 ENCOUNTER — Inpatient Hospital Stay (HOSPITAL_COMMUNITY)
Admission: EM | Admit: 2015-06-25 | Discharge: 2015-06-27 | DRG: 301 | Disposition: A | Discharge status: Home / Self Care | Payer: BLUE CROSS/BLUE SHIELD | Attending: Family Medicine | Admitting: Family Medicine

## 2015-06-25 ENCOUNTER — Encounter (HOSPITAL_COMMUNITY): Payer: Self-pay | Admitting: Emergency Medicine

## 2015-06-25 DIAGNOSIS — G902 Horner's syndrome: Secondary | ICD-10-CM

## 2015-06-25 DIAGNOSIS — I7771 Dissection of carotid artery: Secondary | ICD-10-CM | POA: Diagnosis not present

## 2015-06-25 DIAGNOSIS — R51 Headache: Secondary | ICD-10-CM | POA: Diagnosis not present

## 2015-06-25 DIAGNOSIS — I10 Essential (primary) hypertension: Secondary | ICD-10-CM | POA: Diagnosis not present

## 2015-06-25 DIAGNOSIS — Z823 Family history of stroke: Secondary | ICD-10-CM

## 2015-06-25 DIAGNOSIS — Z8679 Personal history of other diseases of the circulatory system: Secondary | ICD-10-CM | POA: Diagnosis present

## 2015-06-25 DIAGNOSIS — G43909 Migraine, unspecified, not intractable, without status migrainosus: Secondary | ICD-10-CM | POA: Insufficient documentation

## 2015-06-25 DIAGNOSIS — Z8249 Family history of ischemic heart disease and other diseases of the circulatory system: Secondary | ICD-10-CM

## 2015-06-25 HISTORY — DX: Low back pain: M54.5

## 2015-06-25 HISTORY — DX: Cervicalgia: M54.2

## 2015-06-25 HISTORY — DX: Low back pain, unspecified: M54.50

## 2015-06-25 HISTORY — DX: Radiculopathy, lumbar region: M54.16

## 2015-06-25 HISTORY — DX: Radiculopathy, cervical region: M54.12

## 2015-06-25 HISTORY — DX: Migraine, unspecified, not intractable, without status migrainosus: G43.909

## 2015-06-25 LAB — PROTIME-INR
INR: 1.32 (ref 0.00–1.49)
Prothrombin Time: 16.5 seconds — ABNORMAL HIGH (ref 11.6–15.2)

## 2015-06-25 LAB — CBC WITH DIFFERENTIAL/PLATELET
BASOS PCT: 0 %
Basophils Absolute: 0 10*3/uL (ref 0.0–0.1)
Eosinophils Absolute: 0 10*3/uL (ref 0.0–0.7)
Eosinophils Relative: 0 %
HEMATOCRIT: 42.2 % (ref 36.0–46.0)
HEMOGLOBIN: 14.8 g/dL (ref 12.0–15.0)
LYMPHS ABS: 1 10*3/uL (ref 0.7–4.0)
LYMPHS PCT: 14 %
MCH: 33.9 pg (ref 26.0–34.0)
MCHC: 35.1 g/dL (ref 30.0–36.0)
MCV: 96.6 fL (ref 78.0–100.0)
MONOS PCT: 2 %
Monocytes Absolute: 0.2 10*3/uL (ref 0.1–1.0)
NEUTROS ABS: 5.9 10*3/uL (ref 1.7–7.7)
NEUTROS PCT: 84 %
Platelets: 255 10*3/uL (ref 150–400)
RBC: 4.37 MIL/uL (ref 3.87–5.11)
RDW: 11.7 % (ref 11.5–15.5)
WBC: 7.1 10*3/uL (ref 4.0–10.5)

## 2015-06-25 LAB — BASIC METABOLIC PANEL
ANION GAP: 8 (ref 5–15)
BUN: 18 mg/dL (ref 6–20)
CHLORIDE: 104 mmol/L (ref 101–111)
CO2: 24 mmol/L (ref 22–32)
Calcium: 9 mg/dL (ref 8.9–10.3)
Creatinine, Ser: 0.6 mg/dL (ref 0.44–1.00)
Glucose, Bld: 128 mg/dL — ABNORMAL HIGH (ref 65–99)
POTASSIUM: 3.8 mmol/L (ref 3.5–5.1)
SODIUM: 136 mmol/L (ref 135–145)

## 2015-06-25 MED ORDER — ACETAMINOPHEN 325 MG PO TABS: 650.0000 mg | ORAL_TABLET | Freq: Four times a day (QID) | ORAL | Status: DC | PRN

## 2015-06-25 MED ORDER — ALUM & MAG HYDROXIDE-SIMETH 200-200-20 MG/5ML PO SUSP: 30.0000 mL | Freq: Four times a day (QID) | ORAL | Status: DC | PRN

## 2015-06-25 MED ORDER — HEPARIN BOLUS VIA INFUSION
1000.0000 [IU] | Freq: Once | INTRAVENOUS | Status: AC
  Administered 2015-06-25: 1000 [IU] via INTRAVENOUS

## 2015-06-25 MED ORDER — ASPIRIN EC 325 MG PO TBEC
325.0000 mg | DELAYED_RELEASE_TABLET | Freq: Every day | ORAL | Status: DC
  Administered 2015-06-26: 325 mg via ORAL
  Filled 2015-06-25 (×2): qty 1

## 2015-06-25 MED ORDER — LISINOPRIL 10 MG PO TABS: 10.0000 mg | ORAL_TABLET | Freq: Every day | ORAL | Status: DC

## 2015-06-25 MED ORDER — IBUPROFEN 400 MG PO TABS: 200.0000 mg | ORAL_TABLET | Freq: Four times a day (QID) | ORAL | Status: DC | PRN

## 2015-06-25 MED ORDER — HEPARIN (PORCINE) IN NACL 100-0.45 UNIT/ML-% IJ SOLN
900.0000 [IU]/h | INTRAMUSCULAR | Status: DC
  Administered 2015-06-25: 750 [IU]/h via INTRAVENOUS
  Filled 2015-06-25: qty 250

## 2015-06-25 MED ORDER — LISINOPRIL 10 MG PO TABS
10.0000 mg | ORAL_TABLET | Freq: Every day | ORAL | Status: DC
  Administered 2015-06-26 – 2015-06-27 (×3): 10 mg via ORAL
  Filled 2015-06-25 (×3): qty 1

## 2015-06-25 MED ORDER — LIDOCAINE HCL (PF) 1 % IJ SOLN
INTRAMUSCULAR | Status: AC
  Filled 2015-06-25: qty 5

## 2015-06-25 MED ORDER — ACETAMINOPHEN 650 MG RE SUPP: 650.0000 mg | Freq: Four times a day (QID) | RECTAL | Status: DC | PRN

## 2015-06-25 MED ORDER — IOPAMIDOL (ISOVUE-370) INJECTION 76%
75.0000 mL | Freq: Once | INTRAVENOUS | Status: AC | PRN
  Administered 2015-06-25: 75 mL via INTRAVENOUS

## 2015-06-25 MED ORDER — SUMATRIPTAN SUCCINATE 50 MG PO TABS: 50.0000 mg | ORAL_TABLET | ORAL | Status: DC | PRN

## 2015-06-25 NOTE — H&P (Signed)
History and Physical  [name] WUJ:[id number] DOB: April 09, 1966 DOA: 06/25/2015  Referring physician: Dr Clarene Duke, ED physician PCP: Remus Loffler, PA-C   Chief Complaint: Sent by ophthalmologist  HPI: [name] is a 49 y.o. female  With a history of migraine headaches. The patient is seen in emergency department after 10 days of headache and left ptosis and a small left pupil. The patient's was evaluated at the emergency department at [facility] for the same symptoms approximately 8 days ago. The patient had an MRI and MRA which showed no acute intracranial abnormality. The patient subsequently saw her ophthalmologist, who ordered a CT of her chest CTA of her neck. The patient was contacted by her ophthalmologist, who instructed her to go to the hospital for anticoagulation. 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 She denies eye pain, visual changes, focal motor weakness, fevers, chills, chest pain, shortness of breath.   Review of Systems:   Pt denies any fevers, chills, nausea, vomiting, diarrhea, constipation, abdominal pain, shortness of breath, dyspnea on exertion, orthopnea, cough, wheezing, palpitations, headache, vision changes, lightheadedness, dizziness, diarrhea, constipation, melena, rectal bleeding.  Review of systems are otherwise negative  Past Medical History  Diagnosis Date  . Low back pain   . Lumbar radiculopathy   . Neck pain   . Cervical radiculopathy   . Migraine headache    Past Surgical History  Procedure Laterality Date  . Back surgery    . Cervical fusion     Social History:  reports that she has never smoked. She does not have any smokeless tobacco history on file. She reports that she does not drink alcohol or use illicit drugs. Patient lives [place] is able to participate in activities of daily living  No Known Allergies  Family History  Problem Relation Age of Onset  . Stroke Paternal Grandfather     . Hypertension Father   . CAD Father   . Stroke Father   . Hypertension Mother      Prior to Admission medications   Medication Sig Start Date End Date Taking? Authorizing Provider  aspirin EC 325 MG tablet Take 325 mg by mouth daily.   Yes Historical Provider, MD  ibuprofen (ADVIL,MOTRIN) 200 MG tablet Take 200 mg by mouth every 6 (six) hours as needed for moderate pain.   Yes Historical Provider, MD  Multiple Vitamins-Minerals (MULTI ADULT GUMMIES PO) Take by mouth daily.   Yes Historical Provider, MD  predniSONE (DELTASONE) 10 MG tablet 12 day package as directed per instructions. 06/22/15  Yes Historical Provider, MD  Probiotic Product ([employer] ADV PROBIOTIC GUMMIES PO) Take by mouth daily.   Yes Historical Provider, MD  SUMAtriptan (IMITREX) 50 MG tablet Take 50 mg by mouth every 2 (two) hours as needed for migraine. May repeat in 2 hours if headache persists or recurs.   Yes Historical Provider, MD  fluticasone (FLONASE) 50 MCG/ACT nasal spray Place 2 sprays into both nostrils daily.  06/22/15   Historical Provider, MD  montelukast (SINGULAIR) 10 MG tablet Take 10 mg by mouth daily. 06/22/15   Historical Provider, MD    Physical Exam: BP 152/95 mmHg  Pulse 86  Temp(Src) 98.1 F (36.7 C) (Oral)  Resp 16  Ht  (1.6 m)  Wt 53.978 kg (119 lb)  BMI 21.09 kg/m2  SpO2 100%  General:middle-aged Caucasian female . Awake and alert and oriented x3. No acute cardiopulmonary distress.  Eyes:  mild left ptosis and  mild left miosis.  Extraocular muscles are intact. Sclerae anicteric and noninjected.  ENT:  Moist mucosal membranes. No mucosal lesions.  Neck: Neck supple without lymphadenopathy. No carotid bruits. No masses palpated.  Cardiovascular: Regular rate with normal S1-S2 sounds. No murmurs, rubs, gallops auscultated. No JVD.  Respiratory: Good respiratory effort with no wheezes, rales, rhonchi. Lungs clear to auscultation bilaterally.  Abdomen: Soft, nontender, nondistended.  Active bowel sounds. No masses or hepatosplenomegaly  Skin: Dry, warm to touch. 2+ dorsalis pedis and radial pulses. Musculoskeletal: No calf or leg pain. All major joints not erythematous nontender.  Psychiatric: Intact judgment and insight.  Neurologic: No focal neurological deficits. Cranial nerves II through XII are grossly intact.           Labs on Admission:  Basic Metabolic Panel:  Recent Labs Lab 06/25/15 1950  NA 136  K 3.8  CL 104  CO2 24  GLUCOSE 128*  BUN 18  CREATININE 0.60  CALCIUM 9.0   Liver Function Tests: No results for input(s): AST, ALT, ALKPHOS, BILITOT, PROT, ALBUMIN in the last 168 hours. No results for input(s): LIPASE, AMYLASE in the last 168 hours. No results for input(s): AMMONIA in the last 168 hours. CBC:  Recent Labs Lab 06/25/15 1950  WBC 7.1  NEUTROABS 5.9  HGB 14.8  HCT 42.2  MCV 96.6  PLT 255   Cardiac Enzymes: No results for input(s): CKTOTAL, CKMB, CKMBINDEX, TROPONINI in the last 168 hours.  BNP (last 3 results) No results for input(s): BNP in the last 8760 hours.  ProBNP (last 3 results) No results for input(s): PROBNP in the last 8760 hours.  CBG: No results for input(s): GLUCAP in the last 168 hours.  Radiological Exams on Admission: Ct Angio Neck W/cm &/or Wo/cm  06/25/2015  CLINICAL DATA:  Horner syndrome. Left eye drooping. Headache and dizziness. EXAM: CT ANGIOGRAPHY NECK TECHNIQUE: Multidetector CT imaging of the neck was performed using the standard protocol during bolus administration of intravenous contrast. Multiplanar CT image reconstructions and MIPs were obtained to evaluate the vascular anatomy. Carotid stenosis measurements (when applicable) are obtained utilizing NASCET criteria, using the distal internal carotid diameter as the denominator. CONTRAST:  75 mL Isovue 370 IV COMPARISON:  MR head 06/17/2015 FINDINGS: Aortic arch: Normal aortic arch without aneurysm or dissection. Proximal great vessels widely  patent. Lung apices reported separately on chest CT from today. Right carotid system: Normal right carotid. No dissection or atherosclerotic disease. Carotid bifurcation is normal. Left carotid system: Left common carotid artery normal. Left carotid bifurcation normal. No evidence of atherosclerotic disease. There is irregular narrowing of the lumen of the cervical internal carotid artery on the left. There is hematoma in the wall of the internal carotid artery. Findings consistent with dissection. Luminal narrowing of approximately 50% diameter stenosis. No aneurysm. The lumen is narrowed to the skullbase but retains normal caliber through the cavernous segment. Vertebral arteries:Both vertebral arteries widely patent. Negative for dissection or stenosis. Skeleton: ACDF C5-6 and C6-7 with solid fusion. Disc degeneration and spurring at C3-4 and C4-5. No acute skeletal abnormality. Other neck: Prominent vertebral collaterals are present in the neck filled with contrast. This may be due to central venous stenosis or arm positioning during the injection. 8 mm left thyroid nodule. Thyroid normal in size. No cervical adenopathy. 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 This narrows the lumen by 50% diameter stenosis. No aneurysm. Normal right carotid and normal vertebral arteries bilaterally. Critical  Value/emergent results were called by telephone at the time of interpretation on 06/25/2015 at 5:15 pm to Dr. Charlotte Sanes , who verbally acknowledged these results. Electronically Signed   By: Marlan Palau M.D.   On: 06/25/2015 17:15   Ct Chest W Contrast  06/25/2015  CLINICAL DATA:  Horner's syndrome EXAM: CT CHEST WITH CONTRAST TECHNIQUE: Multidetector CT imaging of the chest was performed during intravenous contrast administration. CONTRAST:  75 mL Isovue 370 IV COMPARISON:  None. FINDINGS: Mediastinum/Nodes: The heart is normal in size. No pericardial effusion.  No suspicious mediastinal, hilar, or axillary lymphadenopathy. Visualized thyroid is notable for a 5 mm left thyroid nodule/cyst. Lungs/Pleura: Lungs are clear.  No suspicious pulmonary nodules. Minimal dependent atelectasis in posterior right lower lobe. No focal consolidation. No pleural effusion or pneumothorax. Upper abdomen: Visualized upper abdomen is notable for a 2.1 cm cyst in the medial segment left hepatic dome (series 7/ image 53). Musculoskeletal: Mild degenerative changes of the visualized thoracolumbar spine. Cervical spine fixation hardware. IMPRESSION: Normal CT chest. Electronically Signed   By: Charline Bills M.D.   On: 06/25/2015 16:58    EKG: Independently revNormal sinus rhythm. Normal intervals. No acute ST changes  Assessment/Plan Present on Admission:  . Carotid artery dissection (HCC) . Hypertension . Horner's syndrome  This patient was discussed with the ED physician, including pertinent vitals, physical exam findings, labs, and imaging.  We also discussed care given by the ED provider.  #1 carotid artery dissection   Discussed the patient with Dr. Myra Gianotti of vascular and Dr Thad Ranger of neurology.  Will admit the patient for anticoagulation and bridge to coumadin.  Patient is non-surgical at this time and anticoagulation is for secondary stroke prevention.  Goal INR 2-3.  Will likely be able to transition patient to lovenox and complete bridge as an outpatient.    Check INR tomorrow.   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

## 2015-06-25 NOTE — ED Notes (Signed)
Pt's eye dr called and states she was sending pt due to her having a dissecting carotid artery. Pt c/o headache.

## 2015-06-25 NOTE — Progress Notes (Signed)
ANTICOAGULATION CONSULT NOTE - Initial Consult  Pharmacy Consult for heparin Indication: carotid artery dissection  No Known Allergies  Patient Measurements: Height: 5\' 3"  (160 cm) Weight: 119 lb (53.978 kg) IBW/kg (Calculated) : 52.4 Heparin Dosing Weight: 52.4 kg  Vital Signs: Temp: 98.1 F (36.7 C) (10/14 1912) Temp Source: Oral (10/14 1912) BP: 162/93 mmHg (10/14 1912) Pulse Rate: 115 (10/14 1912)  Labs: No results for input(s): HGB, HCT, PLT, APTT, LABPROT, INR, HEPARINUNFRC, CREATININE, CKTOTAL, CKMB, TROPONINI in the last 72 hours.  Estimated Creatinine Clearance: 71.1 mL/min (by C-G formula based on Cr of 0.7).   Medical History: History reviewed. No pertinent past medical history.  Medications:  See medication history  Assessment: 49 yo lady to start heparin for carotid artery dissection.  She was not on anticoagulants PTA. Goal of Therapy:  Heparin level 0.3-0.5 units/ml Monitor platelets by anticoagulation protocol: Yes   Plan:  Heparin bolus 1000 units and drip at 750 units/hr Check heparin level 6-8 hours after start Daily HL and CBC while on heparin Monitor for bleeding complications  [name] 06/25/2015,7:31 PM

## 2015-06-25 NOTE — ED Notes (Signed)
[facility], all questions answered.

## 2015-06-25 NOTE — ED Notes (Signed)
Dr. Clarene DukeMcManus spoke with vascular surgeon, per him [facility]

## 2015-06-25 NOTE — ED Provider Notes (Signed)
CSN: [id number]     Arrival date & time 06/25/15  1904 History   First MD Initiated Contact with Patient 06/25/15 1910     No chief complaint on file.     HPI Pt was seen at 1920. Per pt, c/o gradual onset and persistence of constant left sided "headache" for the past 10 days. Pt states she was evaluated at a local [facility] 10 days ago for same, then sent to the ED due to the finding of "left eyelid drooping" and "a small left pupil." Pt states she was evaluated in the ED with MRI/MRI brain and discharged to f/u with Opthalmology MD. Pt states she f/u with them today, had a CT chest and CT-A neck performed. Pt states she was called at home by the Ophthalmologist and told to go to the nearest hospital for admission for anticoagulation. Pt denies any new symptoms. Denies eye pain, no visual changes, no focal motor weakness, no tingling/numbness in extremities, no CP/SOB, no abd pain, no N/V/D, no fevers, no rash, no hx recent trauma.    Past Medical History  Diagnosis Date  . Low back pain   . Lumbar radiculopathy   . Neck pain   . Cervical radiculopathy   . Migraine headache    Past Surgical History  Procedure Laterality Date  . Back surgery    . Cervical fusion      Social History  Substance Use Topics  . Smoking status: Never Smoker   . Smokeless tobacco: None  . Alcohol Use: No    Review of Systems ROS: Statement: All systems negative except as marked or noted in the HPI; Constitutional: Negative for fever and chills. ; ; Eyes: Negative for eye pain, redness and discharge. ; ; ENMT: Negative for ear pain, hoarseness, nasal congestion, sinus pressure and sore throat. ; ; Cardiovascular: Negative for chest pain, palpitations, diaphoresis, dyspnea and peripheral edema. ; ; Respiratory: Negative for cough, wheezing and stridor. ; ; Gastrointestinal: Negative for nausea, vomiting, diarrhea, abdominal pain, blood in stool, hematemesis, jaundice and rectal bleeding. . ; ; Genitourinary:  Negative for dysuria, flank pain and hematuria. ; ; Musculoskeletal: Negative for back pain and neck pain. Negative for swelling and trauma.; ; Skin: Negative for pruritus, rash, abrasions, blisters, bruising and skin lesion.; ; Neuro: +headache, left ptosis, left miosis. Negative for lightheadedness and neck stiffness. Negative for weakness, altered level of consciousness , altered mental status, extremity weakness, paresthesias, involuntary movement, seizure and syncope.      Allergies  Review of patient's allergies indicates no known allergies.  Home Medications   Prior to Admission medications   Medication Sig Start Date End Date Taking? Authorizing Provider  ibuprofen (ADVIL,MOTRIN) 200 MG tablet Take 200 mg by mouth every 6 (six) hours as needed for moderate pain.    Historical Provider, MD  SUMAtriptan (IMITREX) 50 MG tablet Take 50 mg by mouth every 2 (two) hours as needed for migraine. May repeat in 2 hours if headache persists or recurs.    Historical Provider, MD   BP 162/93 mmHg  Pulse 115  Temp(Src) 98.1 F (36.7 C) (Oral)  Resp 20  Ht  (1.6 m)  Wt 119 lb (53.978 kg)  BMI 21.09 kg/m2  SpO2 99%    Physical Exam  1925: Physical examination:  Nursing notes reviewed; Vital signs and O2 SAT reviewed;  Constitutional: Well developed, Well nourished, Well hydrated, Tearful.; Head:  Normocephalic, atraumatic; Eyes: EOMI without pain. PERRL, +miosis left pupil. No scleral icterus.  No conjunctival injection.; ENMT: Mouth and pharynx normal, Mucous membranes moist; Neck: Supple, Full range of motion, No lymphadenopathy; Cardiovascular: Tachycardic rate and rhythm, No gallop; Respiratory: Breath sounds clear & equal bilaterally, No wheezes.  Speaking full sentences with ease, Normal respiratory effort/excursion; Chest: Nontender, Movement normal; Abdomen: Soft, Nontender, Nondistended, Normal bowel sounds; Genitourinary: No CVA tenderness; Extremities: Pulses normal, No tenderness,  No edema, No calf edema or asymmetry.; Neuro: AA&Ox3, +left ptosis. Major CN grossly intact. No facial droop. Speech clear. No gross focal motor or sensory deficits in extremities.; Skin: Color normal, Warm, Dry.    ED Course  Procedures (including critical care time) Labs Review   Imaging Review  I have personally reviewed and evaluated these images and lab results as part of my medical decision-making.   EKG Interpretation None      MDM  MDM Reviewed: previous chart, nursing note and vitals Reviewed previous: labs, MRI and CT scan Interpretation: labs   ED ECG REPORT   Date: 06/25/2015  Rate: 89  Rhythm: normal sinus rhythm  QRS Axis: normal  Intervals: normal  ST/T Wave abnormalities: normal  Conduction Disutrbances:none  Narrative Interpretation:   Old EKG Reviewed: unchanged; compared to EKG dated 06/17/15 rate slower, otherwise no significant change.   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 Headache and dizziness. EXAM: CT ANGIOGRAPHY NECK TECHNIQUE: Multidetector CT imaging of the neck was performed using the standard protocol during bolus administration of intravenous contrast. Multiplanar CT image reconstructions and MIPs were obtained to evaluate the vascular anatomy. Carotid stenosis measurements (when applicable) are obtained utilizing NASCET criteria, using the distal internal carotid diameter as the denominator. CONTRAST:  75 mL Isovue 370 IV COMPARISON:  MR head 06/17/2015 FINDINGS: Aortic arch: Normal aortic arch without aneurysm or dissection. Proximal great vessels widely patent. Lung apices reported separately on chest CT from today. Right carotid system: Normal right carotid. No dissection or atherosclerotic disease. Carotid bifurcation is normal. Left carotid system: Left common carotid artery normal. Left carotid bifurcation normal. No evidence of atherosclerotic disease. There is irregular narrowing of  the lumen of the cervical internal carotid artery on the left. There is hematoma in the wall of the internal carotid artery. Findings consistent with dissection. Luminal narrowing of approximately 50% diameter stenosis. No aneurysm. The lumen is narrowed to the skullbase but retains normal caliber through the cavernous segment. Vertebral arteries:Both vertebral arteries widely patent. Negative for dissection or stenosis. Skeleton: ACDF C5-6 and C6-7 with solid fusion. Disc degeneration and spurring at C3-4 and C4-5. No acute skeletal abnormality. Other neck: Prominent vertebral collaterals are present in the neck filled with contrast. This may be due to central venous stenosis or arm positioning during the injection. 8 mm left thyroid nodule. Thyroid normal in size. No cervical adenopathy. 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 This narrows the lumen by 50% diameter stenosis. No aneurysm. Normal right carotid and normal vertebral arteries bilaterally. Critical Value/emergent results were called by telephone at the time of interpretation on 06/25/2015 at 5:15 pm to Dr. Charlotte Sanes , who verbally acknowledged these results. Electronically Signed   By: Marlan Palau M.D.   On: 06/25/2015 17:15   Ct Chest W Contrast 06/25/2015  CLINICAL DATA:  Horner's syndrome EXAM: CT CHEST WITH CONTRAST TECHNIQUE: Multidetector CT imaging of the chest was performed during intravenous contrast administration. CONTRAST:  75 mL Isovue 370 IV COMPARISON:  None. FINDINGS:  Mediastinum/Nodes: The heart is normal in size. No pericardial effusion. No suspicious mediastinal, hilar, or axillary lymphadenopathy. Visualized thyroid is notable for a 5 mm left thyroid nodule/cyst. Lungs/Pleura: Lungs are clear.  No suspicious pulmonary nodules. Minimal dependent atelectasis in posterior right lower lobe. No focal consolidation. No pleural effusion or pneumothorax. Upper abdomen: Visualized  upper abdomen is notable for a 2.1 cm cyst in the medial segment left hepatic dome (series 7/ image 53). Musculoskeletal: Mild degenerative changes of the visualized thoracolumbar spine. Cervical spine fixation hardware. IMPRESSION: Normal CT chest. Electronically Signed   By: Charline BillsSriyesh  Krishnan M.D.   On: 06/25/2015 16:58   Mr Angiogram Head Wo Contrast 06/17/2015  CLINICAL DATA:  Anisocoria.  Headache. EXAM: MRI HEAD WITHOUT CONTRAST MRA HEAD WITHOUT CONTRAST TECHNIQUE: Multiplanar, multiecho pulse sequences of the brain and surrounding structures were obtained without intravenous contrast. Angiographic images of the head were obtained using MRA technique without contrast. COMPARISON:  CT head 06/17/2015 FINDINGS: MRI HEAD FINDINGS Ventricle size is normal.  Cerebral volume is normal. Negative for acute infarct. Minimal chronic microvascular ischemic changes in the white matter. Negative for hemorrhage or mass Pituitary normal in size. Cervical medullary junction is normal. Normal orbit. Paranasal sinuses are clear. MRA HEAD FINDINGS Both vertebral arteries patent to the basilar. PICA patent bilaterally. Basilar widely patent. Superior cerebellar and posterior cerebral arteries are normal Internal carotid artery is normal bilaterally. Anterior and middle cerebral arteries are normal bilaterally. No significant cranial stenosis Negative for cerebral aneurysm. IMPRESSION: No acute intracranial abnormality.  Normal for age. Negative MRA head. Electronically Signed   By: Marlan Palauharles  Clark M.D.   On: 06/17/2015 20:44   Mr Brain Wo Contrast 06/17/2015  CLINICAL DATA:  Anisocoria.  Headache. EXAM: MRI HEAD WITHOUT CONTRAST MRA HEAD WITHOUT CONTRAST TECHNIQUE: Multiplanar, multiecho pulse sequences of the brain and surrounding structures were obtained without intravenous contrast. Angiographic images of the head were obtained using MRA technique without contrast. COMPARISON:  CT head 06/17/2015 FINDINGS: MRI HEAD FINDINGS  Ventricle size is normal.  Cerebral volume is normal. Negative for acute infarct. Minimal chronic microvascular ischemic changes in the white matter. Negative for hemorrhage or mass Pituitary normal in size. Cervical medullary junction is normal. Normal orbit. Paranasal sinuses are clear. MRA HEAD FINDINGS Both vertebral arteries patent to the basilar. PICA patent bilaterally. Basilar widely patent. Superior cerebellar and posterior cerebral arteries are normal Internal carotid artery is normal bilaterally. Anterior and middle cerebral arteries are normal bilaterally. No significant cranial stenosis Negative for cerebral aneurysm. IMPRESSION: No acute intracranial abnormality.  Normal for age. Negative MRA head. Electronically Signed   By: Marlan Palauharles  Clark M.D.   On: 06/17/2015 20:44    Results for orders placed or performed during the hospital encounter of 06/25/15  Basic metabolic panel  Result Value Ref Range   Sodium 136 135 - 145 mmol/L   Potassium 3.8 3.5 - 5.1 mmol/L   Chloride 104 101 - 111 mmol/L   CO2 24 22 - 32 mmol/L   Glucose, Bld 128 (H) 65 - 99 mg/dL   BUN 18 6 - 20 mg/dL   Creatinine, Ser 0.980.60 0.44 - 1.00 mg/dL   Calcium 9.0 8.9 - 11.910.3 mg/dL   GFR calc non Af Amer >60 >60 mL/min   GFR calc Af Amer >60 >60 mL/min   Anion gap 8 5 - 15  CBC with Differential  Result Value Ref Range   WBC 7.1 4.0 - 10.5 K/uL   RBC 4.37 3.87 - 5.11  MIL/uL   Hemoglobin 14.8 12.0 - 15.0 g/dL   HCT 40.9 81.1 - 91.4 %   MCV 96.6 78.0 - 100.0 fL   MCH 33.9 26.0 - 34.0 pg   MCHC 35.1 30.0 - 36.0 g/dL   RDW 78.2 95.6 - 21.3 %   Platelets 255 150 - 400 K/uL   Neutrophils Relative % 84 %   Neutro Abs 5.9 1.7 - 7.7 K/uL   Lymphocytes Relative 14 %   Lymphs Abs 1.0 0.7 - 4.0 K/uL   Monocytes Relative 2 %   Monocytes Absolute 0.2 0.1 - 1.0 K/uL   Eosinophils Relative 0 %   Eosinophils Absolute 0.0 0.0 - 0.7 K/uL   Basophils Relative 0 %   Basophils Absolute 0.0 0.0 - 0.1 K/uL  Protime-INR  Result  Value Ref Range   Prothrombin Time 16.5 (H) 11.6 - 15.2 seconds   INR 1.32 0.00 - 1.49     1915:  T/C to [facility] Dr. Myra Gianotti, case discussed, including:  HPI, pertinent PM/SHx, VS/PE, dx testing, ED course and treatment:  States he has viewed the CT scan images, there is no acute surgical emergency at this time, pt can be admitted overnight to [facility] for IV heparin/PO coumadin, he can f/u pt in office after discharge next week, pt will need repeat CT scan in 6 months.    2030:  Labs resulted. IV heparin infusing. Dx and testing d/w pt and family.  Questions answered.  Verb understanding, agreeable to observation admit. T/C to [facility] Dr. Adrian Blackwater, case discussed, including:  HPI, pertinent PM/SHx, VS/PE, dx testing, ED course and treatment:  Agreeable to admit, requests will come to the ED for further evaluation.     Samuel Jester, DO 06/28/15 0004

## 2015-06-26 DIAGNOSIS — Z8249 Family history of ischemic heart disease and other diseases of the circulatory system: Secondary | ICD-10-CM | POA: Diagnosis not present

## 2015-06-26 DIAGNOSIS — I10 Essential (primary) hypertension: Secondary | ICD-10-CM | POA: Diagnosis present

## 2015-06-26 DIAGNOSIS — G902 Horner's syndrome: Secondary | ICD-10-CM | POA: Diagnosis present

## 2015-06-26 DIAGNOSIS — R51 Headache: Secondary | ICD-10-CM | POA: Diagnosis present

## 2015-06-26 DIAGNOSIS — I7771 Dissection of carotid artery: Secondary | ICD-10-CM | POA: Diagnosis present

## 2015-06-26 DIAGNOSIS — Z823 Family history of stroke: Secondary | ICD-10-CM | POA: Diagnosis not present

## 2015-06-26 LAB — CBC
HCT: 44.2 % (ref 36.0–46.0)
Hemoglobin: 15.3 g/dL — ABNORMAL HIGH (ref 12.0–15.0)
MCH: 33.3 pg (ref 26.0–34.0)
MCHC: 34.6 g/dL (ref 30.0–36.0)
MCV: 96.3 fL (ref 78.0–100.0)
PLATELETS: 269 10*3/uL (ref 150–400)
RBC: 4.59 MIL/uL (ref 3.87–5.11)
RDW: 11.3 % — AB (ref 11.5–15.5)
WBC: 8.5 10*3/uL (ref 4.0–10.5)

## 2015-06-26 LAB — PROTIME-INR
INR: 1.18 (ref 0.00–1.49)
PROTHROMBIN TIME: 15.2 s (ref 11.6–15.2)

## 2015-06-26 LAB — HEPARIN LEVEL (UNFRACTIONATED): Heparin Unfractionated: 0.2 IU/mL — ABNORMAL LOW (ref 0.30–0.70)

## 2015-06-26 MED ORDER — ENOXAPARIN (LOVENOX) PATIENT EDUCATION KIT
PACK | Freq: Once | Status: AC
  Administered 2015-06-26: 13:00:00
  Filled 2015-06-26: qty 1

## 2015-06-26 MED ORDER — WARFARIN VIDEO
Freq: Once | Status: AC
  Administered 2015-06-26: 1

## 2015-06-26 MED ORDER — ENOXAPARIN SODIUM 60 MG/0.6ML ~~LOC~~ SOLN
1.0000 mg/kg | Freq: Two times a day (BID) | SUBCUTANEOUS | Status: DC
  Administered 2015-06-26 – 2015-06-27 (×2): 55 mg via SUBCUTANEOUS
  Filled 2015-06-26 (×2): qty 0.6

## 2015-06-26 MED ORDER — WARFARIN SODIUM 5 MG PO TABS
5.0000 mg | ORAL_TABLET | Freq: Once | ORAL | Status: AC
  Administered 2015-06-26: 5 mg via ORAL
  Filled 2015-06-26: qty 1

## 2015-06-26 MED ORDER — WARFARIN - PHARMACIST DOSING INPATIENT: Freq: Every day | Status: DC

## 2015-06-26 MED ORDER — COUMADIN BOOK
Freq: Once | Status: AC
  Administered 2015-06-26: 1
  Filled 2015-06-26: qty 1

## 2015-06-26 NOTE — Progress Notes (Signed)
ANTICOAGULATION CONSULT NOTE - Preliminary  Pharmacy Consult for warfarin Indication: carotid artery dissection  No Known Allergies  Patient Measurements: Height: 5\' 3"  (160 cm) Weight: 116 lb 14.4 oz (53.025 kg) IBW/kg (Calculated) : 52.4 HEPARIN DW (KG): 53   Vital Signs: Temp: 98.3 F (36.8 C) (10/14 2247) Temp Source: Oral (10/14 2247) BP: 146/82 mmHg (10/15 0000) Pulse Rate: 78 (10/15 0000)  Labs:  Recent Labs  06/25/15 1950  HGB 14.8  HCT 42.2  PLT 255  LABPROT 16.5*  INR 1.32  CREATININE 0.60   Estimated Creatinine Clearance: 71.1 mL/min (by C-G formula based on Cr of 0.6).  Medical History: Past Medical History  Diagnosis Date  . Low back pain   . Lumbar radiculopathy   . Neck pain   . Cervical radiculopathy   . Migraine headache     Medications:  Scheduled:  . aspirin EC  325 mg Oral Daily  . lidocaine (PF)      . lisinopril  10 mg Oral Daily  . warfarin  5 mg Oral Once  . Warfarin - Pharmacist Dosing Inpatient   Does not apply q1800    Assessment: 49 yo female on heparin for carotid artery dissection. Starting warfarin.   Goal of Therapy:  INR 2-3   Plan:  Give warfarin 5mg  PO once tonight.  Preliminary review of pertinent patient information completed.  Dawn HawkingAnnie Aguilar clinical pharmacist will complete review during morning rounds to assess the patient and finalize treatment regimen.  [name], RPH 06/26/2015,1:25 AM

## 2015-06-26 NOTE — Discharge Instructions (Signed)
 Warfarin: What You Need to Know Warfarin is an anticoagulant. Anticoagulants help prevent the formation of blood clots. They also help stop the growth of blood clots. Warfarin is sometimes referred to as a "blood thinner."  Normally, when body tissues are cut or damaged, the blood clots in order to prevent blood loss. Sometimes clots form inside your blood vessels and obstruct the flow of blood through your circulatory system (thrombosis). These clots may travel through your bloodstream and become lodged in smaller blood vessels in your brain, which can cause a stroke, or in your lungs (pulmonary embolism). WHO SHOULD USE WARFARIN? Warfarin is prescribed for people at risk of developing harmful blood clots:  People with surgically implanted mechanical heart valves, irregular heart rhythms called atrial fibrillation, and certain clotting disorders.  People who have developed harmful blood clotting in the past, including those who have had a stroke or a pulmonary embolism, or thrombosis in their legs (deep vein thrombosis [DVT]).  People with an existing blood clot, such as a pulmonary embolism. WARFARIN DOSING Warfarin tablets come in different strengths. Each tablet strength is a different color, with the amount of warfarin (in milligrams) clearly printed on the tablet. If the color of your tablet is different than usual when you receive a new prescription, report it immediately to your pharmacist or health care provider. WARFARIN MONITORING The goal of warfarin therapy is to lessen the clotting tendency of blood but not prevent clotting completely. Your health care provider will monitor the anticoagulation effect of warfarin closely and adjust your dose as needed. For your safety, blood tests called prothrombin time (PT) or international normalized ratio (INR) are used to measure the effects of warfarin. Both of these tests can be done with a finger stick or a blood draw. The longer it takes the  blood to clot, the higher the PT or INR. Your health care provider will inform you of your "target" PT or INR range. If, at any time, your PT or INR is above the target range, there is a risk of bleeding. If your PT or INR is below the target range, there is a risk of clotting. Whether you are started on warfarin while you are in the hospital or in your health care provider's office, you will need to have your PT or INR checked within one week of starting the medicine. Initially, some people are asked to have their PT or INR checked as much as twice a week. Once you are on a stable maintenance dose, the PT or INR is checked less often, usually once every 2 to 4 weeks. The warfarin dose may be adjusted if the PT or INR is not within the target range. It is important to keep all laboratory and health care provider follow-up appointments. Not keeping appointments could result in a chronic or permanent injury, pain, or disability because warfarin is a medicine that requires close monitoring. WHAT ARE THE SIDE EFFECTS OF WARFARIN?  Too much warfarin can cause bleeding (hemorrhage) from any part of the body. This may include bleeding from the gums, blood in the urine, bloody or dark stools, a nosebleed that is not easily stopped, coughing up blood, or vomiting blood.  Too little warfarin can increase the risk of blood clots.  Too little or too much warfarin can also increase the risk of a stroke.  Warfarin use may cause a skin rash or irritation, an unusual fever, continual nausea or stomach upset, or severe pain in your joints or   back. SPECIAL PRECAUTIONS WHILE TAKING WARFARIN Warfarin should be taken exactly as directed. It is very important to take warfarin as directed since bleeding or blood clots could result in chronic or permanent injury, pain, or disability.  Take your medicine at the same time every day. If you forget to take your dose, you can take it if it is within 6 hours of when it was  due.  Do not change the dose of warfarin on your own to make up for missed or extra doses.  If you miss more than 2 doses in a row, you should contact your health care provider for advice. Avoid situations that cause bleeding. You may have a tendency to bleed more easily than usual while taking warfarin. The following actions can limit bleeding:  Using a softer toothbrush.  Flossing with waxed floss rather than unwaxed floss.  Shaving with an electric razor rather than a blade.  Limiting the use of sharp objects.  Avoiding potentially harmful activities, such as contact sports. Warfarin and Pregnancy or Breastfeeding  Warfarin is not advised during the first trimester of pregnancy due to an increased risk of birth defects. In certain situations, a woman may take warfarin after her first trimester of pregnancy. A woman who becomes pregnant or plans to become pregnant while taking warfarin should notify her health care provider immediately.  Although warfarin does not pass into breast milk, a woman who wishes to breastfeed while taking warfarin should also consult with her health care provider. Alcohol, Smoking, and Illicit Drug Use  Alcohol affects how warfarin works in the body. It is best to avoid alcoholic drinks or consume very small amounts while taking warfarin. In general, alcohol intake should be limited to 1 oz (30 mL) of liquor, 6 oz (180 mL) of wine, or 12 oz (360 mL) of beer each day. Notify your health care provider if you change your alcohol intake.  Smoking affects how warfarin works. It is best to avoid smoking while taking warfarin. Notify your health care provider if you change your smoking habits.  It is best to avoid all illicit drugs while taking warfarin since there are few studies that show how warfarin interacts with these drugs. Other Medicines and Dietary Supplements Many prescription and over-the-counter medicines can interfere with warfarin. Be sure all of your  health care providers know you are taking warfarin. Notify your health care provider who prescribed warfarin for you or your pharmacist before starting or stopping any new medicines, including over-the-counter vitamins, dietary supplements, and pain medicines. Your warfarin dose may need to be adjusted. Some common over-the-counter medicines that may increase the risk of bleeding while taking warfarin include:   Acetaminophen.  Aspirin.  Nonsteroidal anti-inflammatory medicines (NSAIDs), such as ibuprofen or naproxen.  Vitamin E. Dietary Considerations  Foods that have moderate or high amounts of vitamin K can interfere with warfarin. Avoid major changes in your diet or notify your health care provider before changing your diet. Eat a consistent amount of foods that have moderate or high amounts of vitamin K. Eating less foods containing vitamin K can increase the risk of bleeding. Eating more foods containing vitamin K can increase the risk of blood clots. Additional questions about dietary considerations can be discussed with a dietitian. Foods that are very high in vitamin K:  Greens, such as Swiss chard and beet, collard, mustard, or turnip greens (fresh or frozen, cooked).  Kale (fresh or frozen, cooked).  Parsley (raw).  Spinach (cooked). Foods that are   high in vitamin K:  Asparagus (frozen, cooked).  Broccoli.  Bok choy (cooked).  Brussels sprouts (fresh or frozen, cooked).  Cabbage (cooked).   Coleslaw. Foods that are moderately high in vitamin K:  Blueberries.  Black-eyed peas.  Endive (raw).  Green leaf lettuce (raw).  Green scallions (raw).  Kale (raw).  Okra (frozen, cooked).  Plantains (fried).  Romaine lettuce (raw).  Sauerkraut (canned).  Spinach (raw). CALL YOUR CLINIC OR HEALTH CARE PROVIDER IF YOU:  Plan to have any surgery or procedure.  Feel sick, especially if you have diarrhea or vomiting.  Experience or anticipate any major changes  in your diet.  Start or stop a prescription or over-the-counter medicine.  Become, plan to become, or think you may be pregnant.  Are having heavier than usual menstrual periods.  Have had a fall, accident, or any symptoms of bleeding or unusual bruising.  Develop an unusual fever. CALL 911 IN THE [place] OR GO TO THE EMERGENCY DEPARTMENT IF YOU:   Think you may be having an allergic reaction to warfarin. The signs of an allergic reaction could include itching, rash, hives, swelling, chest tightness, or trouble breathing.  See signs of blood in your urine. The signs could include reddish, pinkish, or tea-colored urine.  See signs of blood in your stools. The signs could include bright red or black stools.  Vomit or cough up blood. In these instances, the blood could have either a bright red or a "coffee-grounds" appearance.  Have bleeding that will not stop after applying pressure for 30 minutes such as cuts, nosebleeds, or other injuries.  Have severe pain in your joints or back.  Have a new and severe headache.  Have sudden weakness or numbness of your face, arm, or leg, especially on one side of your body.  Have sudden confusion or trouble understanding.  Have sudden trouble seeing in one or both eyes.  Have sudden trouble walking, dizziness, loss of balance, or coordination.  Have trouble speaking or understanding (aphasia).   This information is not intended to replace advice given to you by your health care provider. Make sure you discuss any questions you have with your health care provider.   Document Released: 08/28/2005 Document Revised: 09/18/2014 Document Reviewed: 02/21/2013 Elsevier Interactive Patient Education 2016 [employer]  

## 2015-06-26 NOTE — Progress Notes (Addendum)
ANTICOAGULATION CONSULT NOTE  Pharmacy Consult for heparin and coumadin Indication: carotid artery dissection  No Known Allergies  Patient Measurements: Height: 5\' 3"  (160 cm) Weight: 116 lb 14.4 oz (53.025 kg) IBW/kg (Calculated) : 52.4 Heparin Dosing Weight: 52.4 kg  Vital Signs: Temp: 98.2 F (36.8 C) (10/15 0532) Temp Source: Oral (10/15 0532) BP: 136/89 mmHg (10/15 0532) Pulse Rate: 82 (10/15 0532)  Labs:  Recent Labs  06/25/15 1950 06/26/15 0521  HGB 14.8 15.3*  HCT 42.2 44.2  PLT 255 269  LABPROT 16.5* 15.2  INR 1.32 1.18  HEPARINUNFRC  --  0.20*  CREATININE 0.60  --     Estimated Creatinine Clearance: 71.1 mL/min (by C-G formula based on Cr of 0.6).   Medical History: Past Medical History  Diagnosis Date  . Low back pain   . Lumbar radiculopathy   . Neck pain   . Cervical radiculopathy   . Migraine headache     Medications:  See medication history  Assessment: 49 yo lady on heparin for carotid artery dissection.  Initial heparin level this am is 0.2 units/ml.  Coumadin initiaed last night.  INR today 1.18.  No bleeding complications noted.  Goal of Therapy:  Heparin level 0.3-0.5 units/ml INR 2-3 Monitor platelets by anticoagulation protocol: Yes   Plan:  Increase heparin drip to 900 units/hr Check heparin level 6-8 hours Coumadin 5 mg po again today Daily HL, CBC and PT/INR Monitor for bleeding complications  Beauregard Jarrells Poteet 06/26/2015,8:20 AM  Addum:  Change heparin to lovenox.   Will give 55 mg sq q12 hours Talbert CageLora Theadore Blunck, PharmD

## 2015-06-26 NOTE — Progress Notes (Signed)
PROGRESS NOTE  [name] MWN:[id number] DOB: 12/07/1965 DOA: 06/25/2015 PCP: Remus Loffler, PA-C  Summary: 49 yow PMH migraine headaches presented with a headache. Was seen in ED after 10 days of headache,  left ptosis, and a small left pupil. Was evaluated at [facility] ED the same symptoms 10/8, at that time had MRI and MRA which showed no acute intracranial abnormality. The patient subsequently saw her ophthalmologist, who ordered a CTA of her neck. The patient was contacted by her ophthalmologist, who instructed her to go to the hospital for anticoagulation 10/14. EDP discussed with Dr. Myra Gianotti [facility] who advised anticoagulation with warfarin and outpatient followup. Admitting physician discussed with Dr. Myra Gianotti and Dr. Thad Ranger of neurology--recommended warfarin for secondary stroke prevention with goal INR 2-3.   Assessment/Plan: 1. Left ICA dissection, stable. Continue anticoagulation.  2. Hypertension, stable. Continue Lisinopril. 3. 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    Overall improved. Transition to SQ Lovenox today. Continue warfarin.  Anticipate discharge within 24-48 hours.   Follow-up with Dr. Myra Gianotti [facility] in office next week  repeat CT scan in 6 months  Code Status: Full DVT prophylaxis: Coumadin  Family Communication: Daughter and sister at bedside. Discussed with patient who understands and has no concerns at this time. Disposition Plan: Anticipate discharge within 24-48 hours.   Brendia Sacks, MD  [facility]  Pager [phone] If 7PM-7AM, please contact night-coverage at [email or web address], password [facility] 06/26/2015, 7:36 AM    Consultants:    Procedures:    Antibiotics:    HPI/Subjective: Feels "weird" today. Has some tingling in her chest that is worse while laying down. Has flushes of heat through her body and intermittent feelings of "thickness" in her throat in the last week.  Denies any nausea,  vomiting,  SOB, visual changes, or focal weakness.   Objective: Filed Vitals:   06/25/15 2200 06/25/15 2247 06/26/15 0000 06/26/15 0532  BP: 136/87 143/97 146/82 136/89  Pulse: 89 79 78 82  Temp:  98.3 F (36.8 C)  98.2 F (36.8 C)  TempSrc:  Oral  Oral  Resp: Height:   (1.6 m)    Weight:  53.025 kg (116 lb 14.4 oz)    SpO2: 100% 97% 97% 97%    Intake/Output Summary (Last 24 hours) at 06/26/15 0736 Last data filed at 06/26/15 0532  Gross per 24 hour  Intake    240 ml  Output    300 ml  Net    -60 ml     Filed Weights   06/25/15 1912 06/25/15 2247  Weight: 53.978 kg (119 lb) 53.025 kg (116 lb 14.4 oz)    Exam:    VSS, afebrile, not hypoxic General:  Appears calm and comfortable Eyes: PRRL, right pupil bigger than left, mild left sided ptosis.  ENT: grossly normal hearing, lips & tongue Neck: no LAD, masses or thyromegaly Cardiovascular: RRR, no m/r/g. No LE edema. Respiratory: CTA bilaterally, no w/r/r. Normal respiratory effort. Abdomen: soft, ntnd Skin: no rash or induration seen on limited exam Musculoskeletal: grossly normal tone BUE/BLE Psychiatric: grossly normal mood and affect, speech fluent and appropriate Neurologic: CN 2-12 intact, no pronator drift, no pass-pointing, good strength  New data reviewed:  CBC unremarkable   INR 1.18  Pertinent data since admission:  CT angio of the neck IMPRESSION: Dissection of the left internal carotid artery in the mid to distal cervical carotid. This narrows the lumen by 50% diameter stenosis. No aneurysm.  Normal right carotid and normal vertebral arteries bilaterally.   Pending data:    Scheduled Meds: . aspirin EC  325 mg Oral Daily  . lidocaine (PF)      . lisinopril  10 mg Oral Daily  . Warfarin - Pharmacist Dosing Inpatient   Does not apply q1800   Continuous Infusions: . heparin 750 Units/hr (06/25/15 1942)    Principal Problem:   Carotid artery dissection (HCC) Active  Problems:   Hypertension   Horner's syndrome    By signing my name below, I, Burnett HarryJennifer Gregorio attest that this documentation has been prepared under the direction and in the presence of Brendia Sacksaniel Goodrich, MD Electronically signed: Burnett HarryJennifer Gregorio, Scribe.  06/26/2015 11:50am  I personally performed the services described in this documentation. All medical record entries made by the scribe were at my direction. I have reviewed the chart and agree that the record reflects my personal performance and is accurate and complete. Brendia Sacksaniel Goodrich, MD

## 2015-06-26 NOTE — Progress Notes (Signed)
C/o "still having twinges in my left jaw."

## 2015-06-26 NOTE — Progress Notes (Signed)
Reports twinges in chest, continues on heparin drip, coumadin ordered, received ordered 325mg  Aspirin, MD on floor rounding, will make aware, family in room with patient, no left ptosis noted at this time.

## 2015-06-27 ENCOUNTER — Other Ambulatory Visit: Payer: Self-pay | Admitting: *Deleted

## 2015-06-27 DIAGNOSIS — I7771 Dissection of carotid artery: Secondary | ICD-10-CM

## 2015-06-27 LAB — PROTIME-INR
INR: 1.3 (ref 0.00–1.49)
PROTHROMBIN TIME: 16.4 s — AB (ref 11.6–15.2)

## 2015-06-27 MED ORDER — ENOXAPARIN SODIUM 150 MG/ML ~~LOC~~ SOLN: 1.0000 mg/kg | Freq: Two times a day (BID) | SUBCUTANEOUS | Status: DC

## 2015-06-27 MED ORDER — LISINOPRIL 10 MG PO TABS: 10.0000 mg | ORAL_TABLET | Freq: Every day | ORAL | Status: DC

## 2015-06-27 MED ORDER — WARFARIN SODIUM 5 MG PO TABS: 6.0000 mg | ORAL_TABLET | Freq: Once | ORAL | Status: DC

## 2015-06-27 MED ORDER — WARFARIN SODIUM 6 MG PO TABS: 6.0000 mg | ORAL_TABLET | Freq: Every day | ORAL | Status: DC

## 2015-06-27 MED ORDER — WARFARIN SODIUM 6 MG PO TABS: 6.0000 mg | ORAL_TABLET | Freq: Once | ORAL | Status: DC

## 2015-06-27 NOTE — Progress Notes (Signed)
IV out, discharge instructions reviewed with patient and her daughter, coumadin booklet and lovenox teaching completed and sent home with patient.  Patient and daughter self-administered lovenox this AM.  Left floor via wheelchair to be discharged in care of daughter.  Stable at discharge.

## 2015-06-27 NOTE — Progress Notes (Signed)
ANTICOAGULATION CONSULT NOTE  Pharmacy Consult for lovenox and coumadin Indication: carotid artery dissection  No Known Allergies  Patient Measurements: Height: 5\' 3"  (160 cm) Weight: 116 lb 14.4 oz (53.025 kg) IBW/kg (Calculated) : 52.4 Heparin Dosing Weight: 52.4 kg  Vital Signs: Temp: 98.3 F (36.8 C) (10/16 0522) Temp Source: Oral (10/16 0522) BP: 126/75 mmHg (10/16 0522) Pulse Rate: 84 (10/16 0522)  Labs:  Recent Labs  06/25/15 1950 06/26/15 0521 06/27/15 0830  HGB 14.8 15.3*  --   HCT 42.2 44.2  --   PLT 255 269  --   LABPROT 16.5* 15.2 16.4*  INR 1.32 1.18 1.30  HEPARINUNFRC  --  0.20*  --   CREATININE 0.60  --   --     Estimated Creatinine Clearance: 71.1 mL/min (by C-G formula based on Cr of 0.6).   Medical History: Past Medical History  Diagnosis Date  . Low back pain   . Lumbar radiculopathy   . Neck pain   . Cervical radiculopathy   . Migraine headache     Medications:  See medication history  Assessment: 49 yo lady on coumadin with lovenox bridge for carotid artery dissection.   INR today 1.3.  No bleeding complications noted.  Goal of Therapy:  INR 2-3 Monitor platelets by anticoagulation protocol: Yes   Plan:  Continue lovenox Coumadin 6 mg po today Daily PT/INR Monitor for bleeding complications  [name] 06/27/2015,9:39 AM

## 2015-06-27 NOTE — Progress Notes (Signed)
PROGRESS NOTE  Edman CircleDeleia R Matney ONG:[id number] DOB: [birth year] DOA: 06/25/2015 PCP: Remus LofflerJones, Angel S, PA-C  Summary: [age] yow PMH migraine headaches presented with a headache. Was seen in ED after 10 days of headache,  left ptosis, and a small left pupil. [facility] the same symptoms 10/8, at that time had MRI and MRA which showed no acute intracranial abnormality. The patient subsequently saw her ophthalmologist, who ordered a CTA of her neck. The patient was contacted by her ophthalmologist, who instructed her to go to the hospital for anticoagulation 10/14.  Admitting physician discussed with Dr. Myra GianottiBrabham and Dr. Thad Rangereynolds of neurology--recommended warfarin for secondary stroke prevention with goal INR 2-3.  Assessment/Plan: 1. Left ICA dissection, stable, no new symptoms. Continue SQ Lovenox and warfarin.  2. Hypertension, stable. Continue Lisinopril. 3. 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     Overall improved. Discharge home today on warfarin with Lovenox bridge. Discussed with patient in detail. Educated to seek medical attention for new neurologic symptoms or bleeding.   Follow-up with Dr. Myra GianottiBrabham [facility] in office next week  Repeat CT scan in 6 months  Code Status: Full DVT prophylaxis: Coumadin  Family Communication:   Discussed with patient who understands and has no concerns at this time. Disposition Plan: Discharge home today.   Brendia Sacksaniel Omarrion Carmer, MD  [facility]  Pager [phone] If 7PM-7AM, please contact night-coverage at [email or web address], password [email or web address] 06/27/2015, 7:29 AM  LOS: 1 day   Consultants:    Procedures:    Antibiotics:    HPI/Subjective: Feels better. HA is improved. Able to sleep well last night and eat this morning. No new neurologic symptoms, no weakness or numbness. Vision intact.  Objective: Filed Vitals:   06/26/15 0532 06/26/15 1051 06/26/15 2138 06/27/15 0522  BP: 136/89 140/88 113/78  126/75  Pulse: 82  87 84  Temp: 98.2 F (36.8 C)  98.1 F (36.7 C) 98.3 F (36.8 C)  TempSrc: Oral  Oral Oral  Resp: 14  14 14   Height:      Weight:      SpO2: 97%  98% 99%   No intake or output data in the 24 hours ending 06/27/15 0729   Filed Weights   06/25/15 1912 06/25/15 2247  Weight: 53.978 kg (119 lb) 53.025 kg (116 lb 14.4 oz)    Exam:    VSS, afebrile, not hypoxic General:  Appears comfortable, calm. Eyes: PRRL, right pupil bigger than left (but less pronounced), mild left sided ptosis.  Cardiovascular: Regular rate and rhythm, no murmur, rub or gallop. No lower extremity edema. Respiratory: Clear to auscultation bilaterally, no wheezes, rales or rhonchi. Normal respiratory effort. Musculoskeletal: grossly normal tone bilateral upper and lower extremities; moves all extremities to command, strength all extremities appears intact Psychiatric: grossly normal mood and affect, speech fluent and appropriate Neurologic: CN 2-12 intact, no pronator drift, no pass-pointing, good strength  New data reviewed:  INR 1.3  Pertinent data since admission:  CT angio of the neck IMPRESSION: Dissection of the left internal carotid artery in the mid to distal cervical carotid. This narrows the lumen by 50% diameter stenosis. No aneurysm.  Normal right carotid and normal vertebral arteries bilaterally.   Pending data:  none  Scheduled Meds: . enoxaparin (LOVENOX) injection  1 mg/kg Subcutaneous Q12H  . lisinopril  10 mg Oral Daily  . Warfarin - Pharmacist Dosing Inpatient   Does not apply q1800   Continuous Infusions:    Principal Problem:  Carotid artery dissection (HCC) Active Problems:   Hypertension   Horner's syndrome   By signing my name below, I, Burnett Harry attest that this documentation has been prepared under the direction and in the presence of Brendia Sacks, MD Electronically signed: Burnett Harry, Scribe.  06/27/2015 9:50am  I  personally performed the services described in this documentation. All medical record entries made by the scribe were at my direction. I have reviewed the chart and agree that the record reflects my personal performance and is accurate and complete. Brendia Sacks, MD

## 2015-06-27 NOTE — Discharge Summary (Signed)
Physician Discharge Summary  [name] MVH:[id number] DOB: September 21, 1965 DOA: 06/25/2015  PCP: Remus LofflerJones, Angel S, PA-C  Admit date: 06/25/2015 Discharge date: 06/27/2015  Recommendations for Outpatient Follow-up:  1. Follow up with PCP within 2-3 days to recheck INR level. Started on warfarin for ICA dissection with goal INR 2-3. On Lovenox bridge minimum 5 days and until INR therapeutic. 2. Follow-up with Dr. Myra GianottiBrabham [facility] in office next week will be arranged. 3. Repeat CT scan in 6 months 4. Follow-up elevated blood pressure and titrate medical as needed.   Follow-up Information    Follow up with Remus LofflerJones, Angel S, PA-C In 2 days.   Specialty:  General Practice   Why:  for PT/INR check   Contact information:   [address] [place] [address] [phone]       Follow up with Durene CalBrabham, Wells, MD.   Specialties:  Vascular Surgery, Cardiology   Why:  office will contact you for appointment this coming week   Contact information:   [address] [place] [address] [phone]      Discharge Diagnoses:  1. Left ICA dissection. 2. Horner's syndrome. 3. Hypertension  Discharge Condition: Improved Disposition: Home  Diet recommendation: Regular  Filed Weights   06/25/15 1912 06/25/15 2247  Weight: 53.978 kg (119 lb) 53.025 kg (116 lb 14.4 oz)    History of present illness:  49 yow PMH migraine headaches presented with a headache. Was seen in ED after 10 days of headache, left ptosis, and a small left pupil. [facility] same symptoms 10/8, at that time had MRI and MRA which showed no acute intracranial abnormality. The patient subsequently saw her ophthalmologist, who ordered a CTA of her neck. The patient was contacted by her ophthalmologist, who instructed her to go to the hospital for anticoagulation 10/14.  Admitting physician discussed with Dr. Myra GianottiBrabham and Dr. Thad Rangereynolds of neurology--recommended warfarin for secondary stroke prevention with goal  INR 2-3.  Hospital Course:  Left ICA dissection, revealed on CTA of the neck, remained stable. Case was discussed with Dr. Myra GianottiBrabham of vascular surgery who advised anticoagulation with Lovenox and warfarin, goal INR 2-3 and close outpatient followup--no indication for surgical intervention. Horner's syndrome, secondary to left ICA dissection improved.    Individual issues as below:  1. Left ICA dissection, stable, no new symptoms. Continue SQ Lovenox and warfarin.  2. Hypertension, stable. Continue Lisinopril. 3. 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    Overall improved. Discharge home today on warfarin with Lovenox bridge. Discussed with patient in detail. Educated to seek medical attention for new neurologic symptoms or bleeding.   Follow-up with Dr. Myra GianottiBrabham [facility] in office next week  Repeat CT scan in 6 months   Consultants: 4. none  Procedures:  none  Antibiotics:  none  Discharge Instructions   Current Discharge Medication List    START taking these medications   Details  enoxaparin (LOVENOX) 150 MG/ML injection Inject 0.35 mLs (55 mg total) into the skin every 12 (twelve) hours. Qty: 7 Syringe, Refills: 1    lisinopril (PRINIVIL,ZESTRIL) 10 MG tablet Take 1 tablet (10 mg total) by mouth daily. Qty: 30 tablet, Refills: 0    warfarin (COUMADIN) 6 MG tablet Take 1 tablet (6 mg total) by mouth daily at 6 PM. Qty: 30 tablet, Refills: 0      CONTINUE these medications which have NOT CHANGED   Details  Multiple Vitamins-Minerals (MULTI ADULT GUMMIES PO) Take by mouth  daily.    Probiotic Product ([employer] ADV PROBIOTIC GUMMIES PO) Take by mouth daily.    montelukast (SINGULAIR) 10 MG tablet Take 10 mg by mouth daily.      STOP taking these medications     aspirin EC 325 MG tablet      ibuprofen (ADVIL,MOTRIN) 200 MG tablet      predniSONE (DELTASONE) 10 MG tablet      SUMAtriptan (IMITREX) 50 MG tablet       fluticasone (FLONASE) 50 MCG/ACT nasal spray        No Known Allergies  The results of significant diagnostics from this hospitalization (including imaging, microbiology, ancillary and laboratory) are listed below for reference.    Significant Diagnostic Studies: Ct Angio Neck W/cm &/or Wo/cm  06/25/2015  CLINICAL DATA:  Horner syndrome. Left eye drooping. Headache and dizziness. EXAM: CT ANGIOGRAPHY NECK TECHNIQUE: Multidetector CT imaging of the neck was performed using the standard protocol during bolus administration of intravenous contrast. Multiplanar CT image reconstructions and MIPs were obtained to evaluate the vascular anatomy. Carotid stenosis measurements (when applicable) are obtained utilizing NASCET criteria, using the distal internal carotid diameter as the denominator. CONTRAST:  75 mL Isovue 370 IV COMPARISON:  MR head 06/17/2015 FINDINGS: Aortic arch: Normal aortic arch without aneurysm or dissection. Proximal great vessels widely patent. Lung apices reported separately on chest CT from today. Right carotid system: Normal right carotid. No dissection or atherosclerotic disease. Carotid bifurcation is normal. Left carotid system: Left common carotid artery normal. Left carotid bifurcation normal. No evidence of atherosclerotic disease. There is irregular narrowing of the lumen of the cervical internal carotid artery on the left. There is hematoma in the wall of the internal carotid artery. Findings consistent with dissection. Luminal narrowing of approximately 50% diameter stenosis. No aneurysm. The lumen is narrowed to the skullbase but retains normal caliber through the cavernous segment. Vertebral arteries:Both vertebral arteries widely patent. Negative for dissection or stenosis. Skeleton: ACDF C5-6 and C6-7 with solid fusion. Disc degeneration and spurring at C3-4 and C4-5. No acute skeletal abnormality. Other neck: Prominent vertebral collaterals are present in the neck filled with  contrast. This may be due to central venous stenosis or arm positioning during the injection. 8 mm left thyroid nodule. Thyroid normal in size. No cervical adenopathy. 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 This narrows the lumen by 50% diameter stenosis. No aneurysm. Normal right carotid and normal vertebral arteries bilaterally. Critical Value/emergent results were called by telephone at the time of interpretation on 06/25/2015 at 5:15 pm to Dr. Charlotte Sanes , who verbally acknowledged these results. Electronically Signed   By: Marlan Palau M.D.   On: 06/25/2015 17:15   Ct Chest W Contrast  06/25/2015  CLINICAL DATA:  Horner's syndrome EXAM: CT CHEST WITH CONTRAST TECHNIQUE: Multidetector CT imaging of the chest was performed during intravenous contrast administration. CONTRAST:  75 mL Isovue 370 IV COMPARISON:  None. FINDINGS: Mediastinum/Nodes: The heart is normal in size. No pericardial effusion. No suspicious mediastinal, hilar, or axillary lymphadenopathy. Visualized thyroid is notable for a 5 mm left thyroid nodule/cyst. Lungs/Pleura: Lungs are clear.  No suspicious pulmonary nodules. Minimal dependent atelectasis in posterior right lower lobe. No focal consolidation. No pleural effusion or pneumothorax. Upper abdomen: Visualized upper abdomen is notable for a 2.1 cm cyst in the medial segment left hepatic dome (series 7/ image 53). Musculoskeletal: Mild degenerative changes of the visualized thoracolumbar spine. Cervical spine fixation hardware. IMPRESSION:  Normal CT chest. Electronically Signed   By: Charline Bills M.D.   On: 06/25/2015 16:58   Labs: Basic Metabolic Panel:  Recent Labs Lab 06/25/15 1950  NA 136  K 3.8  CL 104  CO2 24  GLUCOSE 128*  BUN 18  CREATININE 0.60  CALCIUM 9.0    CBC:  Recent Labs Lab 06/25/15 1950 06/26/15 0521  WBC 7.1 8.5  NEUTROABS 5.9  --   HGB 14.8 15.3*  HCT 42.2 44.2  MCV 96.6 96.3    PLT 255 269   Principal Problem:   Carotid artery dissection (HCC) Active Problems:   Hypertension   Horner's syndrome   Time coordinating discharge: 25 minutes  Signed:  Brendia Sacks, MD [facility] 06/27/2015, 10:04 AM  By signing my name below, I, Burnett Harry attest that this documentation has been prepared under the direction and in the presence of Brendia Sacks, MD Electronically signed: Burnett Harry, Scribe.  06/27/2015  I personally performed the services described in this documentation. All medical record entries made by the scribe were at my direction. I have reviewed the chart and agree that the record reflects my personal performance and is accurate and complete. Brendia Sacks, MD

## 2015-07-01 ENCOUNTER — Telehealth: Payer: Self-pay | Admitting: Surgery

## 2015-07-01 NOTE — Telephone Encounter (Signed)
-----   Message from Sharee PimpleMarilyn K McChesney, RN sent at 06/27/2015  8:16 PM EDT ----- Regarding: schedule   ----- Message -----    From: Nada LibmanVance W Brabham, MD    Sent: 06/27/2015   3:08 PM      To: [facility] Charge Pool  Please schedule the patient to follow-up in one month as a new patient for evaluation of a carotid dissection.  She will need a repeat CT angiogram of the head and neck

## 2015-07-01 NOTE — Telephone Encounter (Signed)
Patient had called in to schedule a follow up prior to receiving this message. She is scheduled right now to see VWB on 10/31. I have left her a message to call back to r.s per VWB's instruction. Waiting to hear from pt.

## 2015-07-07 ENCOUNTER — Encounter: Payer: Self-pay | Admitting: Surgery

## 2015-07-08 NOTE — Telephone Encounter (Signed)
LM for pt to call DANA to r.s her Oct 31st appt- in this message I explained that I had additional instructions from VWB and to please call to discuss. dpm

## 2015-07-09 ENCOUNTER — Telehealth: Payer: Self-pay

## 2015-07-09 NOTE — Telephone Encounter (Signed)
[facility] but she is concerned because her PCP told her yesterday to be seen ASAP- I explained that we could keep her appointment on Monday 10/31, but if they wanted her seen urgently, they would need to speak with the nurse.

## 2015-07-09 NOTE — Telephone Encounter (Signed)
Phone call from pt.  Reported she is scheduled 10/31 for consult with Dr. Myra GianottiBrabham, following recent diagnosis of Carotid Dissection.  Reported she was started on Coumadin.  Reported recent INR of 2.3 on 10/25.  Stated she had been feeling more like herself Sunday-Wed. of this week.  Now reported re-occurance of pressure behind left eye, posterior neck pain and pressure of left neck, and dull headache that throbs at times.  Also reported has a vein on left side of forehead that is very prominent.  Stated she went to her PCP this morning and had BP checked; BP 150/85.  Pt. denied any neurological symptoms. Also reported has hx of migraines.  Discussed symptoms with Dr. Imogene Burnhen.  Advised that Carotid Dissection is medically managed, and her current symptoms aren't typical symptoms of carotid dissection.  Advised that pt. should go to the ER for further evaluation of symptoms.  Pt. Advised of Dr. Nicky Pughhen's recommendations.  Verb. Understanding.

## 2015-07-12 ENCOUNTER — Ambulatory Visit (INDEPENDENT_AMBULATORY_CARE_PROVIDER_SITE_OTHER): Payer: BLUE CROSS/BLUE SHIELD | Admitting: Surgery

## 2015-07-12 ENCOUNTER — Encounter: Payer: Self-pay | Admitting: Surgery

## 2015-07-12 VITALS — BP 120/80 | HR 96 | Ht 63.0 in | Wt 116.8 lb

## 2015-07-12 DIAGNOSIS — I7771 Dissection of carotid artery: Secondary | ICD-10-CM | POA: Diagnosis not present

## 2015-07-12 NOTE — Addendum Note (Signed)
Addended by: Adria DillELDRIDGE-LEWIS, Kahlie Deutscher L on: 07/12/2015 05:25 PM   Modules accepted: Orders

## 2015-07-12 NOTE — Progress Notes (Signed)
HISTORY AND PHYSICAL     CC:  F/u for carotid dissection  Referring Provider:  Remus Loffler, PA-C  HPI: This is a 49 y.o. female who presented to the ED on 06/17/15 with c/o left sided headache and jaw pain that had started a couple of days prior.    She also had some left eyelid drooping and a small pupil.  She did have an MRA that was unremarkable.   She was referred to an ophthalmologist and was diagnosed with Horner Syndrome.  She was sent for more test, which revealed the left carotid dissection.  She was called at home and told to report to the nearest ER for anticoagulation.  She was placed on Lovenox/Coumadin.  She states that she is now off the Lovenox and only on coumadin.  Her last INR was 2.3, which she is following up with her PA for this.   She was also started on Lisinopril for blood pressure control.   She states she was still having some left sided pain, but this is actually better today.    She did state that this was an unusual event as she does have a hx of migraine headaches, but are always on the right and usually improve with her medication.    She states that she does have a hx of disc surgery from an anterior approach in the neck 8-10 years ago and when she moves her neck, she can hear movement in her neck.    She states that she has been recommended to f/u with Neurology, but has not yet seen them.     Past Medical History  Diagnosis Date  . Low back pain   . Lumbar radiculopathy   . Neck pain   . Cervical radiculopathy   . Migraine headache     Past Surgical History  Procedure Laterality Date  . Back surgery    . Cervical fusion      No Known Allergies  Current Outpatient Prescriptions  Medication Sig Dispense Refill  . lisinopril (PRINIVIL,ZESTRIL) 10 MG tablet Take 1 tablet (10 mg total) by mouth daily. 30 tablet 0  . warfarin (COUMADIN) 6 MG tablet Take 1 tablet (6 mg total) by mouth daily at 6 PM. 30 tablet 0  . enoxaparin (LOVENOX) 150 MG/ML  injection Inject 0.35 mLs (55 mg total) into the skin every 12 (twelve) hours. (Patient not taking: Reported on 07/12/2015) 7 Syringe 1  . montelukast (SINGULAIR) 10 MG tablet Take 10 mg by mouth daily.    . Multiple Vitamins-Minerals (MULTI ADULT GUMMIES PO) Take by mouth daily.    . Probiotic Product (CVS ADV PROBIOTIC GUMMIES PO) Take by mouth daily.     No current facility-administered medications for this visit.    Family History  Problem Relation Age of Onset  . Stroke Paternal Grandfather   . Hypertension Father   . CAD Father   . Stroke Father   . Hypertension Mother     Social History   Social History  . Marital Status: Married    Spouse Name: N/A  . Number of Children: N/A  . Years of Education: N/A   Occupational History  . Not on file.   Social History Main Topics  . Smoking status: Never Smoker   . Smokeless tobacco: Not on file  . Alcohol Use: No  . Drug Use: No  . Sexual Activity: Not on file   Other Topics Concern  . Not on file   Social History  Narrative     ROS:  Positive    Negative    All sytems reviewed and are negative  Cardiovascular:  chest pain/pressure  palpitations  SOB lying flat  DOE  pain in legs while walking  pain in feet when lying flat  hx of DVT  hx of phlebitis  swelling in legs  varicose veins  Pulmonary:  productive cough  asthma  wheezing  Neurologic:  weakness in  arms  legs  numbness in  arms  legs difficulty speaking or slurred speech  temporary loss of vision in one eye  dizziness  hx migraines  Hematologic:  bleeding problems  problems with blood clotting easily  GI  vomiting blood  blood in stool  GU:  burning with urination  blood in urine  Psychiatric:  hx of major depression  Integumentary:  rashes  ulcers  Constitutional:  fever  chills   PHYSICAL EXAMINATION:  Filed Vitals:   07/12/15 1349    BP: 120/80  Pulse:    Body mass index is 20.7 kg/(m^2).  General:  WDWN in NAD Gait: Normal HENT: WNL, normocephalic Pulmonary: normal non-labored breathing , without Rales, rhonchi,  wheezing Cardiac: RRR, without  Murmurs, rubs or gallops; without carotid bruits Abdomen: soft, NT, no masses Skin: without rashes Vascular Exam/Pulses:  Right Left  Radial 2+ (normal) 2+ (normal)  Ulnar Unable to palpate  Unable to palpate   DP 2+ (normal) 2+ (normal)  PT 2+ (normal) 2+ (normal)   Extremities: without ischemic changes, without Gangrene , without cellulitis; without open wounds; without swelling Musculoskeletal: no muscle wasting or atrophy  Neurologic: A&O X 3; Appropriate Affect ; SENSATION: normal; MOTOR FUNCTION:  moving all extremities equally. Speech is fluent/normal   Non-Invasive Vascular Imaging:   CTA head/neck 06/25/15 IMPRESSION: Dissection of the left internal carotid artery in the mid to distal cervical carotid. This narrows the lumen by 50% diameter stenosis. No aneurysm. Normal right carotid and normal vertebral arteries bilaterally.  Pt meds includes: Statin:  No. Beta Blocker:  No. Aspirin:  No. ACEI:  Yes.   ARB:  No. Other Antiplatelet/Anticoagulant:  Yes.   Coumadin   ASSESSMENT/PLAN:: 49 y.o. female with recent spontaneous left carotid artery dissection   -pt will continue coumadin for 3-6 months and then convert to Plavix/Asa or possibly just a baby aspirin. -she will f/u with Dr. Myra Gianotti in 3 months with a CTA of the head/neck -will get her an appt with Dr. Pearlean Brownie in the next month as Dr. Myra Gianotti did discuss the pt with him a couple of weeks ago.   He will also be able to give more insight into treatment for her headaches. -pt did inquire about activities.  Dr. Myra Gianotti has told her that she can continue to walk/hike, but to have someone with her.  She is to avoid heavy lifting   Doreatha Massed, PA-C [facility] [phone]  Clinic MD:  Pt seen and examined in conjunction with Dr. Myra Gianotti  I agree with the above.  I have seen and evaluated the patient.  The patient initially presented several weeks ago with a left sided Horner's syndrome.  A CT scan revealed a intracranial carotid dissection.  She was placed on anticoagulation because of the luminal narrowing.  She comes in today for further follow-up.  Her Horner's syndrome symptoms are improving.  She is on Coumadin.  I have recommended a repeat CT scan in 3 months.  I'm also sending her to neurology  because of her headaches as well as the dissection.  I discussed with the patient that neurology will likely take over the medical management of her dissection.  I would consider changing her to antiplatelet therapy in approximately 3 months.  Durene CalWells Marialy Urbanczyk

## 2015-07-20 ENCOUNTER — Ambulatory Visit (INDEPENDENT_AMBULATORY_CARE_PROVIDER_SITE_OTHER): Payer: BLUE CROSS/BLUE SHIELD | Admitting: Neurology

## 2015-07-20 ENCOUNTER — Encounter: Payer: Self-pay | Admitting: Neurology

## 2015-07-20 VITALS — BP 128/92 | HR 98 | Ht 63.0 in | Wt 118.0 lb

## 2015-07-20 DIAGNOSIS — I7771 Dissection of carotid artery: Secondary | ICD-10-CM | POA: Diagnosis not present

## 2015-07-20 NOTE — Progress Notes (Signed)
[facility] [address] [place] [id number] [phone]       OFFICE CONSULT NOTE  Dawn. [name] Date of Birth:  1965/10/02 Medical Record Number:  [id number]   Referring MD:  Dr. Myra GianottiBrabham  Reason for Referral:  Carotid dissection  HPI: [name] is a pleasant [age] year Caucasian lady seen for for consultation today. She developed severe left jaw pain and frontal headaches in early October. The headache lasted for nearly 2 weeks and was unusual and not typical for her migraines which usually involve the right side. CT scan of the head on 06/6015 personally reviewed was unremarkable an MRI scan of the brain and MRA will also obtain both of which were also normal. Patient subsequently saw ophthalmologist who noticed mild left Horner syndrome and ordered CT scan of the chest on 06/25/15 which was normal but CT angiogram of the neck which I have personally reviewed showed dissection of the left internal carotid artery in the mid to distal cervical portion but 50% diameter stenosis with hematoma in the wall of the internal carotid artery. There was good distal flow. Patient was seen by Dr. Myra GianottiBrabham and started on Lovenox bridging with warfarin anticoagulation. Patient states her INR has been steady between 2 and 3 and she's had no bleeding or bruising complications. She states her headache has mostly resolved. She occasionally gets tinges of pain in the left jaw temples but these are not bothersome. She denies any preceding neck injury, neck pain, fall, muscle pulling. There is no history of chiropractic manipulation, doing yoga or excessive physical activity. She was found to be mildly hypertensive and has been started on lisinopril which is tolerating well except for occasional dizziness and lightheadedness. She has no prior history of dissection elsewhere, brain aneurysms or any connective tissue disorder. ROS:   14 system review of systems is positive for   palpitations, ringing in the ears, headache, difficult swallowing, dizziness and all other systems negative  PMH:  Past Medical History  Diagnosis Date  . Low back pain   . Lumbar radiculopathy   . Neck pain   . Cervical radiculopathy   . Migraine headache     Social History:  Social History   Social History  . Marital Status: Married    Spouse Name: N/A  . Number of Children: N/A  . Years of Education: N/A   Occupational History  . Not on file.   Social History Main Topics  . Smoking status: Never Smoker   . Smokeless tobacco: Not on file  . Alcohol Use: No  . Drug Use: No  . Sexual Activity: Not on file   Other Topics Concern  . Not on file   Social History Narrative    Medications:   Current Outpatient Prescriptions on File Prior to Visit  Medication Sig Dispense Refill  . lisinopril (PRINIVIL,ZESTRIL) 10 MG tablet Take 1 tablet (10 mg total) by mouth daily. 30 tablet 0  . Multiple Vitamins-Minerals (MULTI ADULT GUMMIES PO) Take by mouth daily.    . Probiotic Product (CVS ADV PROBIOTIC GUMMIES PO) Take by mouth daily.    Marland Kitchen. warfarin (COUMADIN) 6 MG tablet Take 1 tablet (6 mg total) by mouth daily at 6 PM. 30 tablet 0   No current facility-administered medications on file prior to visit.    Allergies:  No Known Allergies  Physical Exam General: Pleasant middle-age Caucasian lady, seated, in no evident distress Head: head normocephalic and atraumatic.   Neck: supple  with no carotid or supraclavicular bruits Cardiovascular: regular rate and rhythm, no murmurs Musculoskeletal: no deformity Skin:  no rash/petichiae Vascular:  Normal pulses all extremities  Neurologic Exam Mental Status: Awake and fully alert. Oriented to place and time. Recent and remote memory intact. Attention span, concentration and fund of knowledge appropriate. Mood and affect appropriate.  Cranial Nerves: Fundoscopic exam reveals sharp disc margins. Pupils unequal, briskly reactive to  light. Mild left ptosis Extraocular movements full without nystagmus. Visual fields full to confrontation. Hearing intact. Facial sensation intact. Face, tongue, palate moves normally and symmetrically.  Motor: Normal bulk and tone. Normal strength in all tested extremity muscles. Sensory.: intact to touch , pinprick , position and vibratory sensation.  Coordination: Rapid alternating movements normal in all extremities. Finger-to-nose and heel-to-shin performed accurately bilaterally. Gait and Station: Arises from chair without difficulty. Stance is normal. Gait demonstrates normal stride length and balance . Able to heel, toe and tandem walk without difficulty.  Reflexes: 1+ and symmetric. Toes downgoing.   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 Brain imaging does not show any infarcts    PLAN: I had a long discussion the patient with regards to her recent cervical carotid artery dissection, discussed risk factors, natural history, treatment options, personally reviewed imaging studies and answered questions. I agree with short-term anticoagulation with warfarin for a total period of 3 months and repeat CT angiogram of the neck and if dissection is healed switch to aspirin for stroke prevention. I advised the patient to avoid any choking neck movements. She can exercise and walk within limits without causing strain on her neck. No specific further neurological testing is necessary at the current time. Greater than 50% time doing this 45 minute consultation was spent on counseling and coordination of care. She will return for follow-up in 3 months or call earlier if necessary. [name], M.D.  Note: This document was prepared with digital dictation and possible smart phrase technology. Any transcriptional errors that result from  this process are unintentional.

## 2015-07-20 NOTE — Patient Instructions (Signed)
I had a long discussion the patient with regards to her recent cervical carotid artery dissection, discussed risk factors, natural history, treatment options, personally reviewed imaging studies and answered questions. I agree with short-term anticoagulation with warfarin for a total period of 3 months and repeat CT angiogram of the neck and if dissection is healed switch to aspirin for stroke prevention. I advised the patient to avoid any choking neck movements. She can exercise and walk within limits without causing strain on her neck. No specific further neurological testing is necessary at the current time. She will return for follow-up in 3 months or call earlier if necessary. Carotid Artery Dissection The carotid arteries are important blood vessels. They are found on each side of your neck. They carry blood from your heart to your brain and other parts of your head. The carotid arteries are made up of 3 layers of tissue. If an artery tears, blood can collect between these layers. This is called a carotid artery dissection. The blood that collects between the layers may form a clot. The clot then blocks blood from flowing to your brain. This can cause a stroke. A stroke can also occur if a piece of the clot breaks free and travels to the brain. Sometimes a stroke happens right away. Other times, it develops hours or days after the dissection. Most people recover from a carotid artery dissection. However, damage from a stroke may take a long time to go away. Some damage may be permanent. CAUSES   Neck injury due to a car crash, sports injury, or similar event. This is called a traumatic dissection.  Weak blood vessel walls. The walls may tear even when no outside injury occurs. This is called a spontaneous dissection. RISK FACTORS Factors that may make a spontaneous carotid artery dissection more likely to occur include:  High blood pressure.  Buildup of fatty substances (plaque) on the artery  walls. This is called hardening of the arteries (atherosclerosis).  Inherited diseases such as Marfan syndrome that weaken the blood vessels.  Fibromuscular dysplasia. This condition is caused by abnormal growth of cells inside the blood vessels.  Taking birth control pills.  Smoking. SYMPTOMS  Symptoms of carotid artery dissection are similar to signs of a stroke. They include:  Headache.  Face or neck pain.  Changes in vision.  Weakness on one side of the face or body.  Drooping eyelid.  Loss of taste.  Ringing in the ear. Any of these symptoms may represent a serious problem that is an emergency. Do not wait to see if the symptoms will go away. Get medical help right away. Call your local emergency services (911 in [place] Do not drive yourself to the hospital. DIAGNOSIS  To determine whether you have a carotid artery dissection, your caregiver will probably order imaging tests. These tests may include:  Helical computed tomography angiography. This test uses a computer to take X-rays of your carotid arteries. A dye may be injected into your blood to show the inside of the blood vessels more clearly.  Magnetic resonance angiography. This test uses a computer and radio waves to make images. A dye may also be used.  Doppler ultrasonography. This test uses sound waves to show the carotid arteries. The test will also show how well blood is flowing through your arteries. TREATMENT  The type of treatment you get will depend on what caused your carotid artery dissection and your overall health. The most important goal of treatment is to prevent  a stroke. If you had a stroke, it is important to get treatment quickly. Treatment options may include:   Blood thinners. This medicine helps to prevent blood clots.  You may get blood thinners through an intravenous line (IV) in the hospital.  Once you are home, you will probably take blood thinners in pill form. You may need to take these  pills for 3 to 6 months.  Angioplasty. This is a procedure that widens a narrow blood vessel.  Stent placement. This is a procedure that places a mesh tube inside the blood vessel to keep it open.  Surgery to repair the area.This may be needed if you cannot take blood thinners or if blood thinners do not work for you. HOME CARE INSTRUCTIONS  Only take over-the-counter or prescription medicines as directed by your caregiver. Follow the directions carefully.  Eat a heart-healthy diet. If you need help with this, meet with a dietician for advice.  Maintain a healthy weight. Ask your caregiver what weight is best for you.  Get regular exercise. Check with your caregiver before starting any new type of exercise.  Do not smoke.  Keep all follow-up appointments as directed by your caregiver. This is how your caregiver can make sure your treatment is working. SEEK MEDICAL CARE IF:  You have any questions about your medicines.  Your symptoms are not getting better. SEEK IMMEDIATE MEDICAL CARE IF:  You have any new symptoms.  Your symptoms get worse.  You feel numb, weak, or dizzy.  You notice changes in your vision or speech.  You have trouble breathing.  You have chest pain.  You have a fever. MAKE SURE YOU:  Understand these instructions.  Will watch your condition.  Will get help right away if you are not doing well or get worse.   This information is not intended to replace advice given to you by your health care provider. Make sure you discuss any questions you have with your health care provider.   Document Released: 11/20/2011 Document Reviewed: 02/13/2013 Elsevier Interactive Patient Education [employer]2016 Elsevier Inc[employer]

## 2015-10-18 ENCOUNTER — Ambulatory Visit
Admission: RE | Admit: 2015-10-18 | Discharge: 2015-10-18 | Disposition: A | Discharge status: Home / Self Care | Payer: BLUE CROSS/BLUE SHIELD | Source: Ambulatory Visit | Attending: Surgery | Admitting: Surgery

## 2015-10-18 DIAGNOSIS — I7771 Dissection of carotid artery: Secondary | ICD-10-CM

## 2015-10-18 MED ORDER — IOPAMIDOL (ISOVUE-370) INJECTION 76%
100.0000 mL | Freq: Once | INTRAVENOUS | Status: AC | PRN
  Administered 2015-10-18: 100 mL via INTRAVENOUS

## 2015-10-19 ENCOUNTER — Encounter: Payer: Self-pay | Admitting: Surgery

## 2015-10-25 ENCOUNTER — Encounter: Payer: Self-pay | Admitting: Surgery

## 2015-10-25 ENCOUNTER — Ambulatory Visit (INDEPENDENT_AMBULATORY_CARE_PROVIDER_SITE_OTHER): Payer: BLUE CROSS/BLUE SHIELD | Admitting: Surgery

## 2015-10-25 VITALS — BP 123/84 | HR 85 | Temp 98.2°F | Resp 16 | Ht 63.0 in | Wt 124.0 lb

## 2015-10-25 DIAGNOSIS — I7771 Dissection of carotid artery: Secondary | ICD-10-CM

## 2015-10-25 NOTE — Progress Notes (Signed)
Patient name: [name] MRN: [id number] DOB: June 18, 1966 Sex: female     Chief Complaint  Patient presents with  . Re-evaluation    3 mo Carotid Artery Dissection f/u    Pt here for Lab resutls of 10-18-15  CTA Head/Neck    HISTORY OF PRESENT ILLNESS: The patient is back for follow-up.  She was initially diagnosed with a left intracranial dissection area this was diagnosed after she presented with a Horner's syndrome and worsening headaches.  I placed her on anticoagulation for 3 months because of the luminal narrowing.  She comes back today for repeat CT scan.  I also sent her to see Dr. Pearlean Brownie.  She reports an increase in her energy level.  She states she feels very good.  Her headaches are dramatically improved.  She denies any focal neurologic symptoms  Past Medical History  Diagnosis Date  . Low back pain   . Lumbar radiculopathy   . Neck pain   . Cervical radiculopathy   . Migraine headache   . Carotid artery dissection [facility])     Past Surgical History  Procedure Laterality Date  . Back surgery    . Cervical fusion      Social History   Social History  . Marital Status: Married    Spouse Name: N/A  . Number of Children: N/A  . Years of Education: N/A   Occupational History  . Not on file.   Social History Main Topics  . Smoking status: Never Smoker   . Smokeless tobacco: Never Used  . Alcohol Use: No  . Drug Use: No  . Sexual Activity: Not on file   Other Topics Concern  . Not on file   Social History Narrative    Family History  Problem Relation Age of Onset  . Stroke Paternal Grandfather   . Hypertension Father   . CAD Father   . Stroke Father   . Hypertension Mother   . Heart murmur Sister   . Heart failure Maternal Grandmother     Allergies as of 10/25/2015  . (No Known Allergies)    Current Outpatient Prescriptions on File Prior to Visit  Medication Sig Dispense Refill  . acetaminophen (TYLENOL) 325 MG tablet Take 650 mg by  mouth every 6 (six) hours as needed.    Marland Kitchen lisinopril (PRINIVIL,ZESTRIL) 10 MG tablet Take 1 tablet (10 mg total) by mouth daily. 30 tablet 0  . Multiple Vitamins-Minerals (MULTI ADULT GUMMIES PO) Take by mouth daily.    . Probiotic Product (CVS ADV PROBIOTIC GUMMIES PO) Take by mouth daily.    Marland Kitchen warfarin (COUMADIN) 6 MG tablet Take 1 tablet (6 mg total) by mouth daily at 6 PM. (Patient taking differently: Take 6 mg by mouth daily at 6 PM. 3 Days per week, take 12 mg and the other days take 9 mg) 30 tablet 0  . fluticasone (FLONASE) 50 MCG/ACT nasal spray Reported on 10/25/2015     No current facility-administered medications on file prior to visit.     REVIEW OF SYSTEMS: Cardiovascular: No chest pain, chest pressure, palpitations, orthopnea, or dyspnea on exertion. No claudication or rest pain,  No history of DVT or phlebitis. Pulmonary: No productive cough, asthma or wheezing. Neurologic: No weakness, paresthesias, aphasia, or amaurosis. No dizziness. Hematologic: No bleeding problems or clotting disorders. Musculoskeletal: No joint pain or joint swelling. Gastrointestinal: No blood in stool or hematemesis Genitourinary: No dysuria or hematuria. Psychiatric:: No history of major depression. Integumentary:  No rashes or ulcers. Constitutional: No fever or chills.  PHYSICAL EXAMINATION:   Vital signs are  Filed Vitals:   10/25/15 1346 10/25/15 1350  BP: 120/81 123/84  Pulse: 85 85  Temp:  98.2 F (36.8 C)  TempSrc:  Oral  Resp:  16  Height:   (1.6 m)  Weight:  124 lb (56.246 kg)  SpO2:  98%   Body mass index is 21.97 kg/(m^2). General: The patient appears their stated age. HEENT:  No gross abnormalities Pulmonary:  Non labored breathing Musculoskeletal: There are no major deformities. Neurologic: No focal weakness or paresthesias are detected, Skin: There are no ulcer or rashes noted. Psychiatric: The patient has normal affect. Cardiovascular: There is a regular rate  and rhythm without significant murmur appreciated.  No carotid bruit   Diagnostic Studies I have reviewed her CT Angioram with the following findings: 1. Resolution of previously seen irregularity within the cervical left internal carotid artery. No residual significant stenosis is present. 2. No other focal vascular lesion.  Assessment: Results left carotid dissection Plan: I am stopping the patient's Coumadin today.  She will take an 81 mg aspirin daily.  She has follow-up with Dr. Pearlean Brownie in the immediate future.  She will follow-up with me on an as-needed basis  V. Charlena Cross, M.D. [facility] [place] Office: [phone] Pager:  [phone]

## 2016-03-06 DIAGNOSIS — M791 Myalgia: Secondary | ICD-10-CM | POA: Diagnosis not present

## 2016-03-06 DIAGNOSIS — M2010 Hallux valgus (acquired), unspecified foot: Secondary | ICD-10-CM | POA: Diagnosis not present

## 2016-03-06 DIAGNOSIS — M79606 Pain in leg, unspecified: Secondary | ICD-10-CM | POA: Diagnosis not present

## 2016-03-06 DIAGNOSIS — M549 Dorsalgia, unspecified: Secondary | ICD-10-CM | POA: Diagnosis not present

## 2016-03-06 DIAGNOSIS — M179 Osteoarthritis of knee, unspecified: Secondary | ICD-10-CM | POA: Diagnosis not present

## 2016-07-13 ENCOUNTER — Emergency Department (HOSPITAL_COMMUNITY)
Admission: EM | Admit: 2016-07-13 | Discharge: 2016-07-14 | Disposition: A | Discharge status: Home / Self Care | Payer: BLUE CROSS/BLUE SHIELD | Attending: Emergency Medicine | Admitting: Emergency Medicine

## 2016-07-13 ENCOUNTER — Encounter (HOSPITAL_COMMUNITY): Payer: Self-pay

## 2016-07-13 DIAGNOSIS — S61412A Laceration without foreign body of left hand, initial encounter: Secondary | ICD-10-CM | POA: Diagnosis not present

## 2016-07-13 DIAGNOSIS — I10 Essential (primary) hypertension: Secondary | ICD-10-CM | POA: Insufficient documentation

## 2016-07-13 DIAGNOSIS — Y929 Unspecified place or not applicable: Secondary | ICD-10-CM | POA: Diagnosis not present

## 2016-07-13 DIAGNOSIS — W231XXA Caught, crushed, jammed, or pinched between stationary objects, initial encounter: Secondary | ICD-10-CM | POA: Diagnosis not present

## 2016-07-13 DIAGNOSIS — Z7901 Long term (current) use of anticoagulants: Secondary | ICD-10-CM | POA: Diagnosis not present

## 2016-07-13 DIAGNOSIS — Z7982 Long term (current) use of aspirin: Secondary | ICD-10-CM | POA: Insufficient documentation

## 2016-07-13 DIAGNOSIS — Y9301 Activity, walking, marching and hiking: Secondary | ICD-10-CM | POA: Insufficient documentation

## 2016-07-13 DIAGNOSIS — Y999 Unspecified external cause status: Secondary | ICD-10-CM | POA: Diagnosis not present

## 2016-07-13 DIAGNOSIS — Z79899 Other long term (current) drug therapy: Secondary | ICD-10-CM | POA: Diagnosis not present

## 2016-07-13 MED ORDER — LIDOCAINE HCL (PF) 1 % IJ SOLN
5.0000 mL | Freq: Once | INTRAMUSCULAR | Status: AC
  Administered 2016-07-13: 5 mL
  Filled 2016-07-13: qty 5

## 2016-07-13 NOTE — ED Provider Notes (Signed)
[facility]-EMERGENCY DEPT Provider Note   CSN: [id number] Arrival date & time: 07/13/16  2300     History   Chief Complaint Chief Complaint  Patient presents with  . Laceration    HPI [name] is a 50 y.o. female.  Patient presents with laceration to left hand caused when walking her dog and the leash pulled across web space between 1st and 2nd digits. No other injury. It occurred around 6:00 pm tonight. She reports it kept bleeding prompting Ed evaluation.    The history is provided by the patient. No language interpreter was used.  Laceration      Past Medical History:  Diagnosis Date  . Carotid artery dissection (HCC)   . Cervical radiculopathy   . Low back pain   . Lumbar radiculopathy   . Migraine headache   . Neck pain     Patient Active Problem List   Diagnosis Date Noted  . Carotid artery dissection (HCC) 06/25/2015  . Migraine 06/25/2015  . Hypertension 06/25/2015  . 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 BACK SURGERY    . CERVICAL FUSION      OB History    No data available       Home Medications    Prior to Admission medications   Medication Sig Start Date End Date Taking? Authorizing Provider  aspirin 81 MG chewable tablet Chew by mouth daily.   Yes Historical Provider, MD  lisinopril (PRINIVIL,ZESTRIL) 10 MG tablet Take 1 tablet (10 mg total) by mouth daily. 06/27/15  Yes Standley Brookinganiel P Goodrich, MD  Multiple Vitamins-Minerals [facility](MULTI ADULT GUMMIES PO) Take by mouth daily.   Yes Historical Provider, MD  acetaminophen (TYLENOL) 325 MG tablet Take 650 mg by mouth every 6 (six) hours as needed.    Historical Provider, MD  fluticasone Aleda Grana(FLONASE) 50 MCG/ACT nasal spray Reported on 10/25/2015 06/22/15   Historical Provider, MD  Probiotic Product ([facility] PROBIOTIC GUMMIES PO) Take by mouth daily.    Historical Provider, MD  warfarin (COUMADIN) 6 MG tablet Take 1 tablet (6 mg total) by mouth daily at 6  PM. Patient taking differently: Take 6 mg by mouth daily at 6 PM. 3 Days per week, take 12 mg and the other days take 9 mg 06/27/15   Standley Brookinganiel P Goodrich, MD    Family History Family History  Problem Relation Age of Onset  . Stroke Paternal Grandfather   . Hypertension Father   . CAD Father   . Stroke Father   . Hypertension Mother   . Heart murmur Sister   . Heart failure Maternal Grandmother     Social History Social History  Substance Use Topics  . Smoking status: Never Smoker  . Smokeless tobacco: Never Used  . Alcohol use No     Allergies   Review of patient's allergies indicates no known allergies.   Review of Systems Review of Systems  Constitutional: Negative for diaphoresis.  Skin: Positive for wound.  Neurological: Negative for numbness.  Hematological: Bruises/bleeds easily.     Physical Exam Updated Vital Signs BP 148/92 (BP Location: Right Arm)   Pulse 97   Temp 98 F (36.7 C) (Oral)   SpO2 97%   Physical Exam  Constitutional: She is oriented to person, place, and time. She appears well-developed and well-nourished.  Neck: Normal range of motion.  Pulmonary/Chest: Effort normal.  Neurological: She is alert and oriented to person, place, and time.  Skin: Skin  is warm and dry.  Small, less than 1 cm laceration at interphalangeal space between 1st and 2nd fingers of left hand. No active bleeding. FROM all fingers.      ED Treatments / Results  Labs (all labs ordered are listed, but only abnormal results are displayed) Labs Reviewed - No data to display  EKG  EKG Interpretation None       Radiology No results found.  Procedures Procedures (including critical care time) LACERATION REPAIR Performed by: Elpidio AnisUPSTILL, Cielo Arias A Authorized by: Elpidio AnisUPSTILL, Pavneet Markwood A Consent: Verbal consent obtained. Risks and benefits: risks, benefits and alternatives were discussed Consent given by: patient Patient identity confirmed: provided demographic  data Prepped and Draped in normal sterile fashion Wound explored  Laceration Location: left hand  Laceration Length: less than 1 cm  No Foreign Bodies seen or palpated  Anesthesia: local infiltration  Local anesthetic: lidocaine 1% w/o epinephrine  Anesthetic total: 1 ml  Irrigation method: syringe Amount of cleaning: standard  Skin closure: 4-0 prolene  Number of sutures: 2  Technique: simple interrupted  Patient tolerance: Patient tolerated the procedure well with no immediate complications.  Medications Ordered in ED Medications  lidocaine (PF) (XYLOCAINE) 1 % injection 5 mL (not administered)     Initial Impression / Assessment and Plan / ED Course  I have reviewed the triage vital signs and the nursing notes.  Pertinent labs & imaging results that were available during my care of the patient were reviewed by me and considered in my medical decision making (see chart for details).  Clinical Course    Uncomplicated laceration to left hand repaired as per procedure note.   Final Clinical Impressions(s) / ED Diagnoses   Final diagnoses:  None  1. Laceration left hand.  New Prescriptions New Prescriptions   No medications on file     Elpidio AnisShari Stanisha Lorenz, Cordelia Poche-C 07/13/16 2352    Marily MemosJason Mesner, MD 07/14/16 [phone]

## 2016-07-13 NOTE — ED Triage Notes (Signed)
Pt reports laceration from the snap on a dog leash that got caught.  Pt has small laceration to the webbing between the thumb and index finger of left hand, small amt of oozing blood present.

## 2016-07-14 MED ORDER — BACITRACIN ZINC 500 UNIT/GM EX OINT
TOPICAL_OINTMENT | CUTANEOUS | Status: AC
  Filled 2016-07-14: qty 0.9

## 2016-09-29 ENCOUNTER — Other Ambulatory Visit: Payer: Self-pay | Admitting: Physician Assistant

## 2017-02-19 ENCOUNTER — Other Ambulatory Visit: Payer: Self-pay | Admitting: Physician Assistant

## 2017-05-24 IMAGING — CT CT HEAD W/O CM
1 series · 1 of 1 positions shown · non-contrast
Comparison: None.

CLINICAL DATA: Left-sided headache, numbness

EXAM:
CT HEAD WITHOUT CONTRAST
TECHNIQUE: Contiguous axial images were obtained from the base of the skull
through the vertex without intravenous contrast.

[Series 200: scout · sagittal · 0.6mm · 0.68mm/px · 1 of 1 slices shown]
[im 1/1]
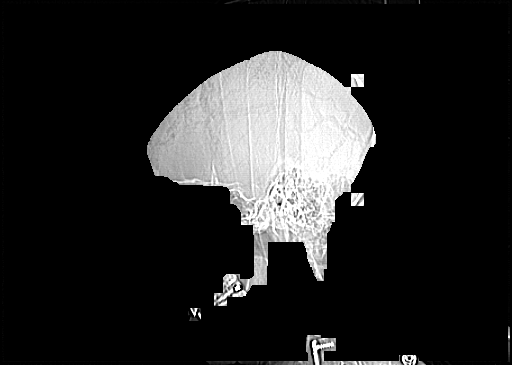

[1 of 1 positions shown; findings below may reference images not displayed]

FINDINGS: There is no evidence of mass effect, midline shift or extra-axial
fluid collections. There is no evidence of a space-occupying lesion
or intracranial hemorrhage. There is no evidence of a cortical-based
area of acute infarction. Mild periventricular white matter low
attenuation likely secondary to microvascular disease.

The ventricles and sulci are appropriate for the patient's age. The
basal cisterns are patent.

Visualized portions of the orbits are unremarkable. The visualized
portions of the paranasal sinuses and mastoid air cells are
unremarkable.

The osseous structures are unremarkable.
IMPRESSION: No acute intracranial pathology.

## 2017-06-22 ENCOUNTER — Other Ambulatory Visit: Payer: Self-pay | Admitting: Physician Assistant

## 2017-08-27 DIAGNOSIS — R05 Cough: Secondary | ICD-10-CM | POA: Diagnosis not present

## 2017-08-27 DIAGNOSIS — J029 Acute pharyngitis, unspecified: Secondary | ICD-10-CM | POA: Diagnosis not present

## 2017-08-27 DIAGNOSIS — Z6821 Body mass index (BMI) 21.0-21.9, adult: Secondary | ICD-10-CM | POA: Diagnosis not present

## 2017-08-27 DIAGNOSIS — J40 Bronchitis, not specified as acute or chronic: Secondary | ICD-10-CM | POA: Diagnosis not present

## 2017-09-17 ENCOUNTER — Other Ambulatory Visit: Payer: Self-pay | Admitting: Physician Assistant

## 2017-09-17 ENCOUNTER — Encounter: Payer: Self-pay | Admitting: Physician Assistant

## 2017-09-17 ENCOUNTER — Ambulatory Visit (INDEPENDENT_AMBULATORY_CARE_PROVIDER_SITE_OTHER): Payer: BLUE CROSS/BLUE SHIELD | Admitting: Physician Assistant

## 2017-09-17 VITALS — BP 123/70 | HR 88 | Temp 97.4°F | Ht 63.0 in | Wt 120.0 lb

## 2017-09-17 DIAGNOSIS — I10 Essential (primary) hypertension: Secondary | ICD-10-CM | POA: Diagnosis not present

## 2017-09-17 DIAGNOSIS — G902 Horner's syndrome: Secondary | ICD-10-CM | POA: Diagnosis not present

## 2017-09-17 DIAGNOSIS — Z1231 Encounter for screening mammogram for malignant neoplasm of breast: Secondary | ICD-10-CM

## 2017-09-17 DIAGNOSIS — Z0000 Encounter for general adult medical examination without abnormal findings: Secondary | ICD-10-CM

## 2017-09-17 DIAGNOSIS — F320 Major depressive disorder, single episode, mild: Secondary | ICD-10-CM | POA: Diagnosis not present

## 2017-09-17 DIAGNOSIS — G43809 Other migraine, not intractable, without status migrainosus: Secondary | ICD-10-CM | POA: Diagnosis not present

## 2017-09-17 MED ORDER — BUPROPION HCL ER (XL) 150 MG PO TB24: 150.0000 mg | ORAL_TABLET | Freq: Every day | ORAL | 1 refills | Status: DC

## 2017-09-17 MED ORDER — LISINOPRIL 10 MG PO TABS: ORAL_TABLET | ORAL | 3 refills | Status: DC

## 2017-09-17 MED ORDER — FLUTICASONE PROPIONATE 50 MCG/ACT NA SUSP: 2.0000 | Freq: Every day | NASAL | 11 refills | Status: DC

## 2017-09-17 NOTE — Progress Notes (Signed)
BP 123/70   Pulse 88   Temp (!) 97.4 F (36.3 C) (Oral)   Ht 5' 3" (1.6 m)   Wt 120 lb (54.4 kg)   BMI 21.26 kg/m    Subjective:    Patient ID: [name], female    DOB: [birth year], 52 y.o.   MRN: [id number]  HPI: [name] is a 52 y.o. female presenting on 09/17/2017 for Re-establish care  This patient comes in for annual well physical examination. All medications are reviewed today. There are no reports of any problems with the medications. All of the medical conditions are reviewed and updated.  Lab work is reviewed and will be ordered as medically necessary.  Is experiencing a lot os anxiety and stress through work and life situation.  Depression screen PHQ 2/9 09/17/2017  Decreased Interest 1  Down, Depressed, Hopeless 2  PHQ - 2 Score 3  Altered sleeping 2  Tired, decreased energy 3  Change in appetite 1  Feeling bad or failure about yourself  1  Trouble concentrating 1  Moving slowly or fidgety/restless 0  Suicidal thoughts 0  PHQ-9 Score 11   .   Relevant past medical, surgical, family and social history reviewed and updated as indicated. Allergies and medications reviewed and updated.  Past Medical History:  Diagnosis Date  . Carotid artery dissection ([place])   . Cervical radiculopathy   . Low back pain   . Lumbar radiculopathy   . Migraine headache   . Neck pain     Past Surgical History:  Procedure Laterality Date  . BACK SURGERY    . CERVICAL FUSION      Review of Systems  Constitutional: Negative.  Negative for activity change, fatigue and fever.  HENT: Negative.   Eyes: Negative.   Respiratory: Negative.  Negative for cough.   Cardiovascular: Negative.  Negative for chest pain.  Gastrointestinal: Negative.  Negative for abdominal pain.  Endocrine: Negative.   Genitourinary: Negative.  Negative for dysuria.  Musculoskeletal: Negative.   Skin: Negative.   Neurological: Negative.   Psychiatric/Behavioral: Positive for  decreased concentration and dysphoric mood. The patient is nervous/anxious.     Allergies as of 09/17/2017   No Known Allergies     Medication List        Accurate as of 09/17/17 10:50 PM. Always use your most recent med list.          acetaminophen 325 MG tablet Commonly known as:  TYLENOL Take 650 mg by mouth every 6 (six) hours as needed.   aspirin 81 MG chewable tablet Chew by mouth daily.   buPROPion 150 MG 24 hr tablet Commonly known as:  WELLBUTRIN XL Take 1 tablet (150 mg total) by mouth daily. After one week increase to 2 tablets each AM.   fluticasone 50 MCG/ACT nasal spray Commonly known as:  FLONASE Place 2 sprays into both nostrils daily. Reported on 10/25/2015   lisinopril 10 MG tablet Commonly known as:  PRINIVIL,ZESTRIL TAKE ONE (1) TABLET EACH DAY   MULTI ADULT GUMMIES PO Take by mouth daily.          Objective:    BP 123/70   Pulse 88   Temp (!) 97.4 F (36.3 C) (Oral)   Ht 5' 3" (1.6 m)   Wt 120 lb (54.4 kg)   BMI 21.26 kg/m   No Known Allergies  Physical Exam  Constitutional: She is oriented to person, place, and time. She appears well-developed and well-nourished.  HENT:  Head: Normocephalic and atraumatic.  Right Ear: Tympanic membrane, external ear and ear canal normal.  Left Ear: Tympanic membrane, external ear and ear canal normal.  Nose: Nose normal. No rhinorrhea.  Mouth/Throat: Oropharynx is clear and moist and mucous membranes are normal. No oropharyngeal exudate or posterior oropharyngeal erythema.  Eyes: Conjunctivae and EOM are normal. Pupils are equal, round, and reactive to light.  Neck: Normal range of motion. Neck supple.  Cardiovascular: Normal rate, regular rhythm, normal heart sounds and intact distal pulses.  Pulmonary/Chest: Effort normal and breath sounds normal.  Abdominal: Soft. Bowel sounds are normal.  Neurological: She is alert and oriented to person, place, and time. She has normal reflexes.  Skin: Skin is  warm and dry. No rash noted.  Psychiatric: She has a normal mood and affect. Her behavior is normal. Judgment and thought content normal.        Assessment & Plan:   1. Well adult exam - CBC with Differential/Platelet - CMP14+EGFR - Lipid panel - TSH  2. Essential hypertension - lisinopril (PRINIVIL,ZESTRIL) 10 MG tablet; TAKE ONE (1) TABLET EACH DAY  Dispense: 90 tablet; Refill: 3  3. Other migraine without status migrainosus, not intractable  4. Horner's syndrome  5. Depression, major, single episode, mild (HCC) - buPROPion (WELLBUTRIN XL) 150 MG 24 hr tablet; Take 1 tablet (150 mg total) by mouth daily. After one week increase to 2 tablets each AM.  Dispense: 60 tablet; Refill: 1    Current Outpatient Medications:  .  acetaminophen (TYLENOL) 325 MG tablet, Take 650 mg by mouth every 6 (six) hours as needed., Disp: , Rfl:  .  aspirin 81 MG chewable tablet, Chew by mouth daily., Disp: , Rfl:  .  buPROPion (WELLBUTRIN XL) 150 MG 24 hr tablet, Take 1 tablet (150 mg total) by mouth daily. After one week increase to 2 tablets each AM., Disp: 60 tablet, Rfl: 1 .  fluticasone (FLONASE) 50 MCG/ACT nasal spray, Place 2 sprays into both nostrils daily. Reported on 10/25/2015, Disp: 16 g, Rfl: 11 .  lisinopril (PRINIVIL,ZESTRIL) 10 MG tablet, TAKE ONE (1) TABLET EACH DAY, Disp: 90 tablet, Rfl: 3 .  Multiple Vitamins-Minerals (MULTI ADULT GUMMIES PO), Take by mouth daily., Disp: , Rfl:  Continue all other maintenance medications as listed above.  Follow up plan: Return in about 4 weeks (around 10/15/2017) for recheck.  Educational handout given for survey  Angel S. Jones PA-C [facility] [address]  [place], [place] [address] [phone]   09/17/2017, 10:50 PM   

## 2017-09-17 NOTE — Patient Instructions (Signed)
In a few days you may receive a survey in the mail or online from [employer] regarding your visit with us today. Please take a moment to fill this out. Your feedback is very important to our whole office. It can help us better understand your needs as well as improve your experience and satisfaction. Thank you for taking your time to complete it. We care about you.  Buren Havey, PA-C  

## 2017-09-18 LAB — CBC WITH DIFFERENTIAL/PLATELET
Basophils Absolute: 0 10*3/uL (ref 0.0–0.2)
Basos: 1 %
EOS (ABSOLUTE): 0.1 10*3/uL (ref 0.0–0.4)
EOS: 2 %
HEMATOCRIT: 42.9 % (ref 34.0–46.6)
Hemoglobin: 14.4 g/dL (ref 11.1–15.9)
IMMATURE GRANULOCYTES: 0 %
Immature Grans (Abs): 0 10*3/uL (ref 0.0–0.1)
Lymphocytes Absolute: 2.1 10*3/uL (ref 0.7–3.1)
Lymphs: 35 %
MCH: 32.6 pg (ref 26.6–33.0)
MCHC: 33.6 g/dL (ref 31.5–35.7)
MCV: 97 fL (ref 79–97)
MONOCYTES: 5 %
MONOS ABS: 0.3 10*3/uL (ref 0.1–0.9)
NEUTROS PCT: 57 %
Neutrophils Absolute: 3.5 10*3/uL (ref 1.4–7.0)
Platelets: 327 10*3/uL (ref 150–379)
RBC: 4.42 x10E6/uL (ref 3.77–5.28)
RDW: 12.5 % (ref 12.3–15.4)
WBC: 6.1 10*3/uL (ref 3.4–10.8)

## 2017-09-18 LAB — CMP14+EGFR
ALK PHOS: 68 IU/L (ref 39–117)
ALT: 9 IU/L (ref 0–32)
AST: 15 IU/L (ref 0–40)
Albumin/Globulin Ratio: 1.7 (ref 1.2–2.2)
Albumin: 4.6 g/dL (ref 3.5–5.5)
BUN/Creatinine Ratio: 19 (ref 9–23)
BUN: 11 mg/dL (ref 6–24)
Bilirubin Total: 1 mg/dL (ref 0.0–1.2)
CO2: 23 mmol/L (ref 20–29)
CREATININE: 0.58 mg/dL (ref 0.57–1.00)
Calcium: 9.3 mg/dL (ref 8.7–10.2)
Chloride: 102 mmol/L (ref 96–106)
GFR calc Af Amer: 124 mL/min/{1.73_m2} (ref >59)
GFR calc non Af Amer: 108 mL/min/{1.73_m2} (ref >59)
GLOBULIN, TOTAL: 2.7 g/dL (ref 1.5–4.5)
GLUCOSE: 85 mg/dL (ref 65–99)
Potassium: 4.1 mmol/L (ref 3.5–5.2)
SODIUM: 138 mmol/L (ref 134–144)
Total Protein: 7.3 g/dL (ref 6.0–8.5)

## 2017-09-18 LAB — LIPID PANEL
CHOL/HDL RATIO: 2.3 ratio (ref 0.0–4.4)
CHOLESTEROL TOTAL: 145 mg/dL (ref 100–199)
HDL: 62 mg/dL (ref >39)
LDL CALC: 73 mg/dL (ref 0–99)
TRIGLYCERIDES: 51 mg/dL (ref 0–149)
VLDL CHOLESTEROL CAL: 10 mg/dL (ref 5–40)

## 2017-09-18 LAB — TSH: TSH: 1.12 u[IU]/mL (ref 0.450–4.500)

## 2017-10-17 ENCOUNTER — Encounter: Payer: Self-pay | Admitting: Physician Assistant

## 2017-10-17 ENCOUNTER — Ambulatory Visit (INDEPENDENT_AMBULATORY_CARE_PROVIDER_SITE_OTHER): Payer: BLUE CROSS/BLUE SHIELD | Admitting: Physician Assistant

## 2017-10-17 DIAGNOSIS — F320 Major depressive disorder, single episode, mild: Secondary | ICD-10-CM | POA: Diagnosis not present

## 2017-10-17 MED ORDER — BUPROPION HCL ER (XL) 300 MG PO TB24: 300.0000 mg | ORAL_TABLET | Freq: Every day | ORAL | 5 refills | Status: DC

## 2017-10-18 NOTE — Progress Notes (Signed)
BP 121/76   Pulse (!) 106   Temp 98.3 F (36.8 C) (Oral)   Ht _0  (1.6 m)   Wt 118 lb 12.8 oz (53.9 kg)   BMI 21.04 kg/m    Subjective:    Patient ID: [name], female    DOB: 1966-03-05, 52 y.o.   MRN: [id number]  HPI: [name] is a 52 y.o. female presenting on 10/17/2017 for Follow-up (1 month rck on medication )  Patient comes in for recheck on her depression.  She states that she is feeling much better.  She has had improvement in her activity, motivation and general well-being. Depression screen [facility] 2/9 10/17/2017 09/17/2017  Decreased Interest 0 1  Down, Depressed, Hopeless 1 2  PHQ - 2 Score 1 3  Altered sleeping - 2  Tired, decreased energy - 3  Change in appetite - 1  Feeling bad or failure about yourself  - 1  Trouble concentrating - 1  Moving slowly or fidgety/restless - 0  Suicidal thoughts - 0  PHQ-9 Score - 11      Past Medical History:  Diagnosis Date  . Carotid artery dissection ([place])   . Cervical radiculopathy   . Low back pain   . Lumbar radiculopathy   . Migraine headache   . Neck pain    Relevant past medical, surgical, family and social history reviewed and updated as indicated. Interim medical history since our last visit reviewed. Allergies and medications reviewed and updated. DATA REVIEWED: CHART IN EPIC  Family History reviewed for pertinent findings.  Review of Systems  Constitutional: Negative.   HENT: Negative.   Eyes: Negative.   Respiratory: Negative.   Gastrointestinal: Negative.   Genitourinary: Negative.   Psychiatric/Behavioral: Positive for dysphoric mood. Negative for agitation, sleep disturbance and suicidal ideas.    Allergies as of 10/17/2017   No Known Allergies     Medication List        Accurate as of 10/17/17 11:59 PM. Always use your most recent med list.          acetaminophen 325 MG tablet Commonly known as:  TYLENOL Take 650 mg by mouth every 6 (six) hours as needed.   aspirin 81  MG chewable tablet Chew by mouth daily.   buPROPion 300 MG 24 hr tablet Commonly known as:  WELLBUTRIN XL Take 1 tablet (300 mg total) by mouth daily. After one week increase to 2 tablets each AM.   fluticasone 50 MCG/ACT nasal spray Commonly known as:  FLONASE Place 2 sprays into both nostrils daily. Reported on 10/25/2015   lisinopril 10 MG tablet Commonly known as:  PRINIVIL,ZESTRIL TAKE ONE (1) TABLET EACH DAY   MULTI ADULT GUMMIES PO Take by mouth daily.          Objective:    BP 121/76   Pulse (!) 106   Temp 98.3 F (36.8 C) (Oral)   Ht _1  (1.6 m)   Wt 118 lb 12.8 oz (53.9 kg)   BMI 21.04 kg/m   No Known Allergies  Wt Readings from Last 3 Encounters:  10/17/17 118 lb 12.8 oz (53.9 kg)  09/17/17 120 lb (54.4 kg)  10/25/15 124 lb (56.2 kg)    Physical Exam  Constitutional: She is oriented to person, place, and time. She appears well-developed and well-nourished.  HENT:  Head: Normocephalic and atraumatic.  Eyes: Conjunctivae and EOM are normal. Pupils are equal, round, and reactive to light.  Cardiovascular: Normal rate, regular  rhythm, normal heart sounds and intact distal pulses.  Pulmonary/Chest: Effort normal and breath sounds normal.  Abdominal: Soft. Bowel sounds are normal.  Neurological: She is alert and oriented to person, place, and time. She has normal reflexes.  Skin: Skin is warm and dry. No rash noted.  Psychiatric: She has a normal mood and affect. Her behavior is normal. Judgment and thought content normal.  Nursing note and vitals reviewed.   Results for orders placed or performed in visit on 09/17/17  CBC with Differential/Platelet  Result Value Ref Range   WBC 6.1 3.4 - 10.8 x10E3/uL   RBC 4.42 3.77 - 5.28 x10E6/uL   Hemoglobin 14.4 11.1 - 15.9 g/dL   Hematocrit 42.9 34.0 - 46.6 %   MCV 97 79 - 97 fL   MCH 32.6 26.6 - 33.0 pg   MCHC 33.6 31.5 - 35.7 g/dL   RDW 12.5 12.3 - 15.4 %   Platelets 327 150 - 379 x10E3/uL   Neutrophils  57 Not Estab. %   Lymphs 35 Not Estab. %   Monocytes 5 Not Estab. %   Eos 2 Not Estab. %   Basos 1 Not Estab. %   Neutrophils Absolute 3.5 1.4 - 7.0 x10E3/uL   Lymphocytes Absolute 2.1 0.7 - 3.1 x10E3/uL   Monocytes Absolute 0.3 0.1 - 0.9 x10E3/uL   EOS (ABSOLUTE) 0.1 0.0 - 0.4 x10E3/uL   Basophils Absolute 0.0 0.0 - 0.2 x10E3/uL   Immature Granulocytes 0 Not Estab. %   Immature Grans (Abs) 0.0 0.0 - 0.1 x10E3/uL  CMP14+EGFR  Result Value Ref Range   Glucose 85 65 - 99 mg/dL   BUN 11 6 - 24 mg/dL   Creatinine, Ser 0.58 0.57 - 1.00 mg/dL   GFR calc non Af Amer 108 >59 mL/min/1.73   GFR calc Af Amer 124 >59 mL/min/1.73   BUN/Creatinine Ratio 19 9 - 23   Sodium 138 134 - 144 mmol/L   Potassium 4.1 3.5 - 5.2 mmol/L   Chloride 102 96 - 106 mmol/L   CO2 23 20 - 29 mmol/L   Calcium 9.3 8.7 - 10.2 mg/dL   Total Protein 7.3 6.0 - 8.5 g/dL   Albumin 4.6 3.5 - 5.5 g/dL   Globulin, Total 2.7 1.5 - 4.5 g/dL   Albumin/Globulin Ratio 1.7 1.2 - 2.2   Bilirubin Total 1.0 0.0 - 1.2 mg/dL   Alkaline Phosphatase 68 39 - 117 IU/L   AST 15 0 - 40 IU/L   ALT 9 0 - 32 IU/L  Lipid panel  Result Value Ref Range   Cholesterol, Total 145 100 - 199 mg/dL   Triglycerides 51 0 - 149 mg/dL   HDL 62 >39 mg/dL   VLDL Cholesterol Cal 10 5 - 40 mg/dL   LDL Calculated 73 0 - 99 mg/dL   Chol/HDL Ratio 2.3 0.0 - 4.4 ratio  TSH  Result Value Ref Range   TSH 1.120 0.450 - 4.500 uIU/mL      Assessment & Plan:   1. Depression, major, single episode, mild (HCC) - buPROPion (WELLBUTRIN XL) 300 MG 24 hr tablet; Take 1 tablet (300 mg total) by mouth daily. After one week increase to 2 tablets each AM.  Dispense: 30 tablet; Refill: 5   Continue all other maintenance medications as listed above.  Follow up plan: Return in about 5 months (around 03/16/2018).  Educational handout given for survey is  Terald Sleeper PA-C [place], [place]  [address] [phone]   10/18/2017, 8:53 PM

## 2017-10-19 MED ORDER — BUPROPION HCL ER (XL) 300 MG PO TB24
300.0000 mg | ORAL_TABLET | Freq: Every day | ORAL | 5 refills | Status: DC
Start: 2017-10-19 — End: 2017-12-11

## 2017-10-19 NOTE — Progress Notes (Signed)
Original order on fax, resent

## 2017-10-19 NOTE — Addendum Note (Signed)
Addended by: Julious PayerHOLT, Logen Fowle D on: 10/19/2017 03:42 PM   Modules accepted: Orders

## 2017-11-06 DIAGNOSIS — Z1231 Encounter for screening mammogram for malignant neoplasm of breast: Secondary | ICD-10-CM | POA: Diagnosis not present

## 2017-11-06 LAB — HM MAMMOGRAPHY

## 2017-12-11 ENCOUNTER — Ambulatory Visit: Payer: BLUE CROSS/BLUE SHIELD | Admitting: Physician Assistant

## 2017-12-11 ENCOUNTER — Encounter: Payer: Self-pay | Admitting: Physician Assistant

## 2017-12-11 VITALS — BP 118/75 | HR 100 | Temp 100.0°F | Ht 63.0 in | Wt 119.8 lb

## 2017-12-11 DIAGNOSIS — R509 Fever, unspecified: Secondary | ICD-10-CM

## 2017-12-11 DIAGNOSIS — J101 Influenza due to other identified influenza virus with other respiratory manifestations: Secondary | ICD-10-CM

## 2017-12-11 LAB — VERITOR FLU A/B WAIVED
INFLUENZA A: POSITIVE — AB
Influenza B: NEGATIVE

## 2017-12-11 MED ORDER — OSELTAMIVIR PHOSPHATE 75 MG PO CAPS: 75.0000 mg | ORAL_CAPSULE | Freq: Two times a day (BID) | ORAL | 0 refills | Status: DC

## 2017-12-11 NOTE — Progress Notes (Signed)
BP 118/75   Pulse 100   Temp 100 F (37.8 C) (Oral)   Ht 5\' 3"  (1.6 m)   Wt 119 lb 12.8 oz (54.3 kg)   BMI 21.22 kg/m    Subjective:    Patient ID: [name], female    DOB: 07-28-1966, 52 y.o.   MRN: [id number]  HPI: [name] is a 52 y.o. female presenting on 12/11/2017 for Cough; nasal drainage; and Chills  This patient has had less than 2 days severe fever, chills, myalgias.  Complains of sinus headache and postnasal drainage. There is copious drainage at times. Associated sore throat, decreased appetite and headache.  Has been exposed to influenza.   Also seasonal allergies are getting quite bad and has started Allegra in the last day or so.  Past Medical History:  Diagnosis Date  . Carotid artery dissection (HCC)   . Cervical radiculopathy   . Low back pain   . Lumbar radiculopathy   . Migraine headache   . Neck pain    Relevant past medical, surgical, family and social history reviewed and updated as indicated. Interim medical history since our last visit reviewed. Allergies and medications reviewed and updated. DATA REVIEWED: CHART IN EPIC  Family History reviewed for pertinent findings.  Review of Systems  Constitutional: Positive for appetite change, chills, fatigue and fever. Negative for activity change.  HENT: Positive for congestion, postnasal drip and sore throat.   Eyes: Negative.   Respiratory: Negative for cough and wheezing.   Cardiovascular: Negative.  Negative for chest pain, palpitations and leg swelling.  Gastrointestinal: Negative.   Genitourinary: Negative.   Musculoskeletal: Positive for myalgias.  Skin: Negative.   Neurological: Positive for headaches.    Allergies as of 12/11/2017   No Known Allergies     Medication List        Accurate as of 12/11/17 12:09 PM. Always use your most recent med list.          acetaminophen 325 MG tablet Commonly known as:  TYLENOL Take 650 mg by mouth every 6 (six) hours as  needed.   aspirin 81 MG chewable tablet Chew by mouth daily.   fluticasone 50 MCG/ACT nasal spray Commonly known as:  FLONASE Place 2 sprays into both nostrils daily. Reported on 10/25/2015   lisinopril 10 MG tablet Commonly known as:  PRINIVIL,ZESTRIL TAKE ONE (1) TABLET EACH DAY   MULTI ADULT GUMMIES PO Take by mouth daily.   oseltamivir 75 MG capsule Commonly known as:  TAMIFLU Take 1 capsule (75 mg total) by mouth 2 (two) times daily.          Objective:    BP 118/75   Pulse 100   Temp 100 F (37.8 C) (Oral)   Ht 5\' 3"  (1.6 m)   Wt 119 lb 12.8 oz (54.3 kg)   BMI 21.22 kg/m   No Known Allergies  Wt Readings from Last 3 Encounters:  12/11/17 119 lb 12.8 oz (54.3 kg)  10/17/17 118 lb 12.8 oz (53.9 kg)  09/17/17 120 lb (54.4 kg)    Physical Exam  Constitutional: She is oriented to person, place, and time. She appears well-developed and well-nourished. No distress.  HENT:  Head: Normocephalic and atraumatic.  Right Ear: Tympanic membrane normal.  Left Ear: Tympanic membrane normal.  Nose: Mucosal edema and rhinorrhea present. Right sinus exhibits no frontal sinus tenderness. Left sinus exhibits no frontal sinus tenderness.  Mouth/Throat: Posterior oropharyngeal erythema present. No oropharyngeal exudate or tonsillar  abscesses.  Eyes: Pupils are equal, round, and reactive to light. Conjunctivae and EOM are normal.  Neck: Normal range of motion.  Cardiovascular: Normal rate, regular rhythm, normal heart sounds, intact distal pulses and normal pulses.  Pulmonary/Chest: Effort normal and breath sounds normal. No respiratory distress.  Abdominal: Soft. Bowel sounds are normal.  Neurological: She is alert and oriented to person, place, and time. She has normal reflexes.  Skin: Skin is warm and dry. No rash noted.  Psychiatric: She has a normal mood and affect. Her behavior is normal. Judgment and thought content normal.  Nursing note and vitals reviewed.         Assessment & Plan:   1. Fever, unspecified fever cause - Veritor Flu A/B Waived  2. Influenza A - oseltamivir (TAMIFLU) 75 MG capsule; Take 1 capsule (75 mg total) by mouth 2 (two) times daily.  Dispense: 10 capsule; Refill: 0   Continue all other maintenance medications as listed above.  Follow up plan: No follow-ups on file.  Educational handout given for survey  Remus LofflerAngel S. Suellen Durocher PA-C [facility] [address]  [place], [place] [address] [phone]   12/11/2017, 12:09 PM

## 2018-01-15 ENCOUNTER — Ambulatory Visit: Payer: BLUE CROSS/BLUE SHIELD | Admitting: Physician Assistant

## 2018-01-15 ENCOUNTER — Encounter: Payer: Self-pay | Admitting: Physician Assistant

## 2018-01-15 VITALS — BP 115/72 | HR 89 | Temp 98.8°F | Ht 63.0 in | Wt 119.8 lb

## 2018-01-15 DIAGNOSIS — G8929 Other chronic pain: Secondary | ICD-10-CM | POA: Diagnosis not present

## 2018-01-15 DIAGNOSIS — M25552 Pain in left hip: Secondary | ICD-10-CM

## 2018-01-15 DIAGNOSIS — M545 Low back pain, unspecified: Secondary | ICD-10-CM

## 2018-01-15 MED ORDER — PREDNISONE 10 MG (21) PO TBPK: ORAL_TABLET | ORAL | 0 refills | Status: DC

## 2018-01-15 MED ORDER — ALBUTEROL SULFATE 108 (90 BASE) MCG/ACT IN AEPB: 2.0000 | INHALATION_SPRAY | Freq: Four times a day (QID) | RESPIRATORY_TRACT | 2 refills | Status: DC | PRN

## 2018-01-16 NOTE — Progress Notes (Signed)
BP 115/72   Pulse 89   Temp 98.8 F (37.1 C) (Oral)   Ht  (1.6 m)   Wt 119 lb 12.8 oz (54.3 kg)   BMI 21.22 kg/m    Subjective:    Patient ID: [name], female    DOB: December 25, 1965, 52 y.o.   MRN: [id number]  HPI: AALA [name] is a 52 y.o. female presenting on 01/15/2018 for Leg Pain (left ) and Back Pain (lower )  Patient has a history of acute rupture of lumbar disc that required surgery.  She has done very well since then.  Over the past month she has had increasing pain that is in the left hip.  It goes down into the groin in the anterior thigh.  The pain is very similar in nature to the previous disc problem and that it is stabbing and aching.  She has not had any falls and it is not causing her to be weak in this leg.  She can improve it by sitting in certain positions pretty constant at this time.  She has always continued to be active with walking and hiking.   Past Medical History:  Diagnosis Date  . Carotid artery dissection (HCC)   . Cervical radiculopathy   . Low back pain   . Lumbar radiculopathy   . Migraine headache   . Neck pain    Relevant past medical, surgical, family and social history reviewed and updated as indicated. Interim medical history since our last visit reviewed. Allergies and medications reviewed and updated. DATA REVIEWED: CHART IN EPIC  Family History reviewed for pertinent findings.  Review of Systems  Constitutional: Negative.   HENT: Negative.   Eyes: Negative.   Respiratory: Negative.   Gastrointestinal: Negative.   Genitourinary: Negative.   Musculoskeletal: Positive for back pain, gait problem and myalgias.    Allergies as of 01/15/2018   No Known Allergies     Medication List        Accurate as of 01/15/18 11:59 PM. Always use your most recent med list.          acetaminophen 325 MG tablet Commonly known as:  TYLENOL Take 650 mg by mouth every 6 (six) hours as needed.   aspirin 81 MG chewable  tablet Chew by mouth daily.   fluticasone 50 MCG/ACT nasal spray Commonly known as:  FLONASE Place 2 sprays into both nostrils daily. Reported on 10/25/2015   lisinopril 10 MG tablet Commonly known as:  PRINIVIL,ZESTRIL TAKE ONE (1) TABLET EACH DAY   MULTI ADULT GUMMIES PO Take by mouth daily.   predniSONE 10 MG (21) Tbpk tablet Commonly known as:  STERAPRED UNI-PAK 21 TAB As directed x 6 days          Objective:    BP 115/72   Pulse 89   Temp 98.8 F (37.1 C) (Oral)   Ht  (1.6 m)   Wt 119 lb 12.8 oz (54.3 kg)   BMI 21.22 kg/m   No Known Allergies  Wt Readings from Last 3 Encounters:  01/15/18 119 lb 12.8 oz (54.3 kg)  12/11/17 119 lb 12.8 oz (54.3 kg)  10/17/17 118 lb 12.8 oz (53.9 kg)    Physical Exam  Constitutional: She is oriented to person, place, and time. She appears well-developed and well-nourished.  HENT:  Head: Normocephalic and atraumatic.  Eyes: Pupils are equal, round, and reactive to light. Conjunctivae and EOM are normal.  Cardiovascular: Normal rate, regular rhythm,  normal heart sounds and intact distal pulses.  Pulmonary/Chest: Effort normal and breath sounds normal.  Abdominal: Soft. Bowel sounds are normal.  Musculoskeletal:       Left hip: She exhibits decreased range of motion and tenderness. She exhibits no swelling, no crepitus and no deformity.       Back:       Arms:      Legs: Neurological: She is alert and oriented to person, place, and time. She has normal reflexes.  Skin: Skin is warm and dry. No rash noted.  Psychiatric: She has a normal mood and affect. Her behavior is normal. Judgment and thought content normal.        Assessment & Plan:   1. Hip pain, acute, left - predniSONE (STERAPRED UNI-PAK 21 TAB) 10 MG (21) TBPK tablet; As directed x 6 days  Dispense: 21 tablet; Refill: 0 - Ambulatory referral to Orthopedic Surgery  2. Chronic left-sided low back pain without sciatica - predniSONE (STERAPRED UNI-PAK 21  TAB) 10 MG (21) TBPK tablet; As directed x 6 days  Dispense: 21 tablet; Refill: 0 - Ambulatory referral to Orthopedic Surgery     Continue all other maintenance medications as listed above.  Follow up plan: No follow-ups on file.  Educational handout given for survey  Remus Loffler PA-C [facility] [address]  [place], [place] [address] [phone]   01/16/2018, 8:14 AM

## 2018-01-18 DIAGNOSIS — M545 Low back pain: Secondary | ICD-10-CM | POA: Diagnosis not present

## 2018-02-06 DIAGNOSIS — M545 Low back pain: Secondary | ICD-10-CM | POA: Diagnosis not present

## 2018-02-15 DIAGNOSIS — M533 Sacrococcygeal disorders, not elsewhere classified: Secondary | ICD-10-CM | POA: Diagnosis not present

## 2018-03-13 ENCOUNTER — Other Ambulatory Visit: Payer: Self-pay | Admitting: Physician Assistant

## 2018-03-13 DIAGNOSIS — I10 Essential (primary) hypertension: Secondary | ICD-10-CM

## 2018-05-03 ENCOUNTER — Other Ambulatory Visit: Payer: Self-pay | Admitting: Physician Assistant

## 2018-05-03 DIAGNOSIS — I10 Essential (primary) hypertension: Secondary | ICD-10-CM

## 2018-05-14 NOTE — Progress Notes (Unsigned)
[facility] Everywhere  

## 2018-06-26 ENCOUNTER — Ambulatory Visit: Payer: BLUE CROSS/BLUE SHIELD

## 2018-06-26 ENCOUNTER — Ambulatory Visit: Payer: BLUE CROSS/BLUE SHIELD | Admitting: Pediatrics

## 2018-06-26 ENCOUNTER — Encounter: Payer: Self-pay | Admitting: Pediatrics

## 2018-06-26 VITALS — BP 140/83 | HR 78 | Temp 97.4°F | Wt 120.2 lb

## 2018-06-26 DIAGNOSIS — Z23 Encounter for immunization: Secondary | ICD-10-CM

## 2018-06-26 DIAGNOSIS — S61210A Laceration without foreign body of right index finger without damage to nail, initial encounter: Secondary | ICD-10-CM

## 2018-06-26 MED ORDER — CEPHALEXIN 500 MG PO CAPS: 500.0000 mg | ORAL_CAPSULE | Freq: Four times a day (QID) | ORAL | 0 refills | Status: DC

## 2018-06-26 NOTE — Progress Notes (Signed)
  Subjective:   Patient ID: [name], female    DOB: 12-16-1965, 52 y.o.   MRN: [id number] CC: R 2nd finger lac  HPI: [name] is a 52 y.o. female   Around 1:00 today she was home for lunch, sliced her right second finger PIP knuckle on a door when taking dog out for a walk.  She wrapped it with gauze, was able to go back to work.  Now is here for further evaluation and stitches.  She takes an aspirin daily.  Not on other blood thinners.  Also with history of hypertension, takes lisinopril once a day.  She is not sure last date of her most recent tetanus shot.  Relevant past medical, surgical, family and social history reviewed. Allergies and medications reviewed and updated. Social History   Tobacco Use  Smoking Status Never Smoker  Smokeless Tobacco Never Used   ROS: Per HPI   Objective:    BP 140/83   Pulse 78   Temp (!) 97.4 F (36.3 C) (Oral)   Wt 120 lb 3.2 oz (54.5 kg)   BMI 21.29 kg/m   Wt Readings from Last 3 Encounters:  06/26/18 120 lb 3.2 oz (54.5 kg)  01/15/18 119 lb 12.8 oz (54.3 kg)  12/11/17 119 lb 12.8 oz (54.3 kg)    Gen: NAD, alert, cooperative with exam, NCAT EYES: EOMI, no conjunctival injection, or no icterus CV: NRRR, normal S1/S2, no murmur, distal pulses 2+ b/l Resp: CTABL, no wheezes, normal WOB Ext: No edema, warm Neuro: Alert and oriented, sensation intact on finger. Skin: 1.5 cm slightly curved laceration over right second finger PIP joint knuckle.  Proximal portion of cut with an almost 1 cm flap.  No tendons visible. <3 sec cap refill fingertip.   Assessment & Plan:  Daviana was seen today for r 2nd finger lac.  Diagnoses and all orders for this visit:  Laceration of right index finger without foreign body without damage to nail, initial encounter With extensive flap, concern for viability of the flap.  Discussed want her to be seen by hand surgery as soon as possible, seen in after hours clinic tonight. Discussed  options, refer to ED vs a couple of stitches here, be seen by hand surgery tomorrow. Pt wants to be seen by hand surgery tomorrow. Wound was irrigated copiously with normal saline.  No foreign bodies.  Bleeding was well controlled.  Hand was cleaned with Betadine.  Lidocaine 1% without epi was used for numbing.  Put in 2 simple interrupted stitches 4-0 ethylene using sterile technique to hold flap in place until further evaluation can be done.  Wound was dressed with gauze. Start antibiotics for prophylaxis.  Urgent referral to hand surgery. Patient aware to expect phone call, [phone] the best phone number to reach her. Tetanus shot given today.  -     Ambulatory referral to Hand Surgery -     cephALEXin (KEFLEX) 500 MG capsule; Take 1 capsule (500 mg total) by mouth 4 (four) times daily.  Other orders -     Td : Tetanus/diphtheria >7yo Preservative  free  Procedure: Verbal consent given by patient. Local anesthesia Lidocaine 1% without 3ml Betadine prep ethylene4-0 suture-2 simple interrupted stitches Cleaned with Saline Antibiotic ointment Dressing applied   Follow up plan: With hand surgery tomorrow Rex Kras, MD [place] [facility]

## 2018-06-27 DIAGNOSIS — S61210A Laceration without foreign body of right index finger without damage to nail, initial encounter: Secondary | ICD-10-CM | POA: Diagnosis not present

## 2018-07-04 DIAGNOSIS — S61210D Laceration without foreign body of right index finger without damage to nail, subsequent encounter: Secondary | ICD-10-CM | POA: Diagnosis not present

## 2018-07-25 DIAGNOSIS — S61210D Laceration without foreign body of right index finger without damage to nail, subsequent encounter: Secondary | ICD-10-CM | POA: Diagnosis not present

## 2018-09-24 ENCOUNTER — Other Ambulatory Visit: Payer: Self-pay | Admitting: Physician Assistant

## 2018-09-24 DIAGNOSIS — I10 Essential (primary) hypertension: Secondary | ICD-10-CM

## 2018-10-09 ENCOUNTER — Other Ambulatory Visit: Payer: Self-pay | Admitting: Physician Assistant

## 2018-11-14 ENCOUNTER — Other Ambulatory Visit: Payer: Self-pay | Admitting: Physician Assistant

## 2018-11-15 NOTE — Telephone Encounter (Signed)
Apt scheduled.  

## 2018-11-15 NOTE — Telephone Encounter (Signed)
[place]. NTBS 30 days given 10/09/18 

## 2018-12-06 ENCOUNTER — Other Ambulatory Visit: Payer: Self-pay

## 2018-12-06 ENCOUNTER — Telehealth (INDEPENDENT_AMBULATORY_CARE_PROVIDER_SITE_OTHER): Payer: BLUE CROSS/BLUE SHIELD | Admitting: Physician Assistant

## 2018-12-06 DIAGNOSIS — F320 Major depressive disorder, single episode, mild: Secondary | ICD-10-CM | POA: Diagnosis not present

## 2018-12-06 DIAGNOSIS — I10 Essential (primary) hypertension: Secondary | ICD-10-CM | POA: Diagnosis not present

## 2018-12-06 DIAGNOSIS — Z0000 Encounter for general adult medical examination without abnormal findings: Secondary | ICD-10-CM

## 2018-12-06 MED ORDER — BUPROPION HCL ER (XL) 150 MG PO TB24
150.0000 mg | ORAL_TABLET | ORAL | 11 refills | Status: DC
Start: 2018-12-06 — End: 2019-01-21

## 2018-12-06 MED ORDER — LISINOPRIL 10 MG PO TABS: 10.0000 mg | ORAL_TABLET | Freq: Every day | ORAL | 11 refills | Status: DC

## 2018-12-06 NOTE — Progress Notes (Signed)
Telephone visit  Subjective: GY:FVCBSWH on meds, hypertension and depression PCP: Terald Sleeper, PA-C QPR:FFMBWG [name] is a 53 y.o. female calls for telephone consult today. Patient provides verbal consent for consult held via phone.  Location of patient: Work Location of provider: [facility] Others present for call: None  This virtual telephone visit is for recheck on her chronic conditions of hypertension and depression.  We have reviewed all of her medications.  In the only change she has is she has stopped using Flonase anymore.  She has taken some Allegra over-the-counter for her allergies and states that it is doing well.  She reports having good blood pressure readings whenever she has had them checked.  She is looking for a home monitor at this point we discussed some options.  She is doing very well with the depression anxiety.  It has been about a year since we started the Wellbutrin.  But she does feel very good on it and does not want to stop it at this time.  She has 1 daughter that is a travel Marine scientist.  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  So this is an added level of stress.  Her second daughter is doing very well after having some depression issues herself few months ago.   ROS: Per HPI  No Known Allergies Past Medical History:  Diagnosis Date  . Carotid artery dissection ([place])   . Cervical radiculopathy   . Low back pain   . Lumbar radiculopathy   . Migraine headache   . Neck pain     Current Outpatient Medications:  .  acetaminophen (TYLENOL) 325 MG tablet, Take 650 mg by mouth every 6 (six) hours as needed., Disp: , Rfl:  .  aspirin 81 MG chewable tablet, Chew by mouth daily., Disp: , Rfl:  .  buPROPion (WELLBUTRIN XL) 300 MG 24 hr tablet, TAKE 1 TABLET DAILY. AFTER 1 WEEK INCREASE TO 2 TABLETS EACH MORNING, Disp: 30 tablet, Rfl: 0 .  cephALEXin (KEFLEX) 500 MG capsule, Take 1 capsule (500 mg total) by mouth 4 (four) times daily., Disp:  28 capsule, Rfl: 0 .  fluticasone (FLONASE) 50 MCG/ACT nasal spray, Place 2 sprays into both nostrils daily. Reported on 10/25/2015 (Patient not taking: Reported on 06/26/2018), Disp: 16 g, Rfl: 11 .  lisinopril (PRINIVIL,ZESTRIL) 10 MG tablet, TAKE ONE (1) TABLET EACH DAY, Disp: 30 tablet, Rfl: 1 .  Multiple Vitamins-Minerals (MULTI ADULT GUMMIES PO), Take by mouth daily., Disp: , Rfl:   Assessment/ Plan: 53 y.o. female   1. Essential hypertension - lisinopril (PRINIVIL,ZESTRIL) 10 MG tablet; Take 1 tablet (10 mg total) by mouth daily.  Dispense: 30 tablet; Refill: 11 - CBC with Differential/Platelet; Future - CMP14+EGFR; Future - Lipid panel; Future - TSH; Future  2. Depression, major, single episode, mild (HCC) - buPROPion (WELLBUTRIN XL) 150 MG 24 hr tablet; Take 1 tablet (150 mg total) by mouth every morning.  Dispense: 30 tablet; Refill: 11  3. Well adult exam - CBC with Differential/Platelet; Future - CMP14+EGFR; Future - Lipid panel; Future - TSH; Future   Start time: 10:06 End time: 10:14  No orders of the defined types were placed in this encounter.   [name] PA-C [facility] [phone]

## 2018-12-18 DIAGNOSIS — D485 Neoplasm of uncertain behavior of skin: Secondary | ICD-10-CM | POA: Diagnosis not present

## 2018-12-18 DIAGNOSIS — L918 Other hypertrophic disorders of the skin: Secondary | ICD-10-CM | POA: Diagnosis not present

## 2018-12-18 DIAGNOSIS — D225 Melanocytic nevi of trunk: Secondary | ICD-10-CM | POA: Diagnosis not present

## 2018-12-18 DIAGNOSIS — Z1283 Encounter for screening for malignant neoplasm of skin: Secondary | ICD-10-CM | POA: Diagnosis not present

## 2019-01-20 ENCOUNTER — Other Ambulatory Visit: Payer: Self-pay | Admitting: Physician Assistant

## 2019-01-21 NOTE — Telephone Encounter (Signed)
Is patient supposed to be on 300mg  or 150mg 

## 2019-03-28 ENCOUNTER — Other Ambulatory Visit: Payer: Self-pay | Admitting: Physician Assistant

## 2019-03-29 ENCOUNTER — Other Ambulatory Visit: Payer: Self-pay | Admitting: Physician Assistant

## 2019-04-29 ENCOUNTER — Encounter: Payer: Self-pay | Admitting: Physician Assistant

## 2019-04-29 ENCOUNTER — Ambulatory Visit (INDEPENDENT_AMBULATORY_CARE_PROVIDER_SITE_OTHER): Payer: BC Managed Care – PPO | Admitting: Physician Assistant

## 2019-04-29 DIAGNOSIS — I10 Essential (primary) hypertension: Secondary | ICD-10-CM | POA: Diagnosis not present

## 2019-04-29 DIAGNOSIS — Z0000 Encounter for general adult medical examination without abnormal findings: Secondary | ICD-10-CM

## 2019-04-29 DIAGNOSIS — G43809 Other migraine, not intractable, without status migrainosus: Secondary | ICD-10-CM | POA: Diagnosis not present

## 2019-04-29 MED ORDER — FLUTICASONE PROPIONATE 50 MCG/ACT NA SUSP: 2.0000 | Freq: Every day | NASAL | 3 refills | Status: DC

## 2019-04-29 MED ORDER — LISINOPRIL 10 MG PO TABS: 10.0000 mg | ORAL_TABLET | Freq: Every day | ORAL | 3 refills | Status: DC

## 2019-04-29 MED ORDER — SUMATRIPTAN SUCCINATE 50 MG PO TABS: ORAL_TABLET | ORAL | 2 refills | Status: DC

## 2019-04-29 MED ORDER — BUPROPION HCL ER (XL) 300 MG PO TB24: ORAL_TABLET | ORAL | 3 refills | Status: DC

## 2019-04-29 NOTE — Progress Notes (Signed)
Telephone visit  Subjective: CC: Hypertension, depression, migraine PCP: Terald Sleeper, PA-C RWE:RXVQMG R [name] is a 53 y.o. female calls for telephone consult today. Patient provides verbal consent for consult held via phone.  Patient is identified with 2 separate identifiers.  At this time the entire area is on COVID-19 social distancing and stay home orders are in place.  Patient is of higher risk and therefore we are performing this by a virtual method.  Location of patient: Work Location of provider: HOME Others present for call: no  Patient is having periodic recheck on her chronic medical conditions and does need refills.  She has been doing very well overall.  She does have migraines, hypertension which has been very well controlled, degenerative disc with radiculopathy, depression.  She is having some new episodes where she is having to worry about job.  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  Her parents have poor health and she knows she will have to stay in this area for now.    ROS: Per HPI  No Known Allergies Past Medical History:  Diagnosis Date  . Carotid artery dissection ([place])   . Cervical radiculopathy   . Low back pain   . Lumbar radiculopathy   . Migraine headache   . Neck pain     Current Outpatient Medications:  .  acetaminophen (TYLENOL) 325 MG tablet, Take 650 mg by mouth every 6 (six) hours as needed., Disp: , Rfl:  .  aspirin 81 MG chewable tablet, Chew by mouth daily., Disp: , Rfl:  .  buPROPion (WELLBUTRIN XL) 300 MG 24 hr tablet, TAKE ONE (1) TABLET EACH DAY, Disp: 90 tablet, Rfl: 3 .  fluticasone (FLONASE) 50 MCG/ACT nasal spray, Place 2 sprays into both nostrils daily., Disp: 48 g, Rfl: 3 .  lisinopril (ZESTRIL) 10 MG tablet, Take 1 tablet (10 mg total) by mouth daily., Disp: 90 tablet, Rfl: 3 .  Multiple Vitamins-Minerals (MULTI ADULT GUMMIES PO), Take by mouth daily., Disp: , Rfl:  .  SUMAtriptan  (IMITREX) 50 MG tablet, TAKE 1 TABLET AS NEEDED AS DIRECTED, Disp: 27 tablet, Rfl: 2  Assessment/ Plan: 53 y.o. female   1. Well adult exam - CBC with Differential/Platelet; Future - CMP14+EGFR; Future - Lipid panel; Future - TSH; Future  2. Essential hypertension - lisinopril (ZESTRIL) 10 MG tablet; Take 1 tablet (10 mg total) by mouth daily.  Dispense: 90 tablet; Refill: 3  3. Other migraine without status migrainosus, not intractable - SUMAtriptan (IMITREX) 50 MG tablet; TAKE 1 TABLET AS NEEDED AS DIRECTED  Dispense: 27 tablet; Refill: 2   Return in about 6 months (around 10/30/2019).  Continue all other maintenance medications as listed above.  Start time: 10:40 AM End time: 10:51 AM  Meds ordered this encounter  Medications  . fluticasone (FLONASE) 50 MCG/ACT nasal spray    Sig: Place 2 sprays into both nostrils daily.    Dispense:  48 g    Refill:  3    Order Specific Question:   Supervising Provider    Answer:   Brooke Bonito  . buPROPion (WELLBUTRIN XL) 300 MG 24 hr tablet    Sig: TAKE ONE (1) TABLET EACH DAY    Dispense:  90 tablet    Refill:  3    Order Specific Question:   Supervising Provider    Answer:   Brooke Bonito  . lisinopril (ZESTRIL) 10 MG tablet  Sig: Take 1 tablet (10 mg total) by mouth daily.    Dispense:  90 tablet    Refill:  3    Order Specific Question:   Supervising Provider    Answer:   Brooke Bonito  . SUMAtriptan (IMITREX) 50 MG tablet    Sig: TAKE 1 TABLET AS NEEDED AS DIRECTED    Dispense:  27 tablet    Refill:  2    Order Specific Question:   Supervising Provider    Answer:   Terald Sleeper [[id number]]    [name] PA-C [facility] [phone]

## 2019-05-15 ENCOUNTER — Other Ambulatory Visit: Payer: Self-pay

## 2019-05-15 ENCOUNTER — Other Ambulatory Visit: Payer: BC Managed Care – PPO

## 2019-05-15 DIAGNOSIS — Z0000 Encounter for general adult medical examination without abnormal findings: Secondary | ICD-10-CM

## 2019-05-16 LAB — LIPID PANEL
Chol/HDL Ratio: 2.5 ratio (ref 0.0–4.4)
Cholesterol, Total: 160 mg/dL (ref 100–199)
HDL: 63 mg/dL (ref >39)
LDL Chol Calc (NIH): 88 mg/dL (ref 0–99)
Triglycerides: 41 mg/dL (ref 0–149)
VLDL Cholesterol Cal: 9 mg/dL (ref 5–40)

## 2019-05-16 LAB — CBC WITH DIFFERENTIAL/PLATELET
Basophils Absolute: 0 10*3/uL (ref 0.0–0.2)
Basos: 1 %
EOS (ABSOLUTE): 0.1 10*3/uL (ref 0.0–0.4)
Eos: 3 %
Hematocrit: 42.9 % (ref 34.0–46.6)
Hemoglobin: 14.5 g/dL (ref 11.1–15.9)
Immature Grans (Abs): 0 10*3/uL (ref 0.0–0.1)
Immature Granulocytes: 0 %
Lymphocytes Absolute: 1.6 10*3/uL (ref 0.7–3.1)
Lymphs: 43 %
MCH: 33 pg (ref 26.6–33.0)
MCHC: 33.8 g/dL (ref 31.5–35.7)
MCV: 98 fL — ABNORMAL HIGH (ref 79–97)
Monocytes Absolute: 0.3 10*3/uL (ref 0.1–0.9)
Monocytes: 8 %
Neutrophils Absolute: 1.7 10*3/uL (ref 1.4–7.0)
Neutrophils: 45 %
Platelets: 270 10*3/uL (ref 150–450)
RBC: 4.39 x10E6/uL (ref 3.77–5.28)
RDW: 11.7 % (ref 11.7–15.4)
WBC: 3.8 10*3/uL (ref 3.4–10.8)

## 2019-05-16 LAB — CMP14+EGFR
ALT: 10 IU/L (ref 0–32)
AST: 18 IU/L (ref 0–40)
Albumin/Globulin Ratio: 2.1 (ref 1.2–2.2)
Albumin: 4.4 g/dL (ref 3.8–4.9)
Alkaline Phosphatase: 62 IU/L (ref 39–117)
BUN/Creatinine Ratio: 18 (ref 9–23)
BUN: 15 mg/dL (ref 6–24)
Bilirubin Total: 0.7 mg/dL (ref 0.0–1.2)
CO2: 24 mmol/L (ref 20–29)
Calcium: 9.3 mg/dL (ref 8.7–10.2)
Chloride: 102 mmol/L (ref 96–106)
Creatinine, Ser: 0.84 mg/dL (ref 0.57–1.00)
GFR calc Af Amer: 92 mL/min/{1.73_m2} (ref >59)
GFR calc non Af Amer: 80 mL/min/{1.73_m2} (ref >59)
Globulin, Total: 2.1 g/dL (ref 1.5–4.5)
Glucose: 85 mg/dL (ref 65–99)
Potassium: 4.4 mmol/L (ref 3.5–5.2)
Sodium: 139 mmol/L (ref 134–144)
Total Protein: 6.5 g/dL (ref 6.0–8.5)

## 2019-05-16 LAB — TSH: TSH: 1.03 u[IU]/mL (ref 0.450–4.500)

## 2019-05-28 DIAGNOSIS — Z1231 Encounter for screening mammogram for malignant neoplasm of breast: Secondary | ICD-10-CM | POA: Diagnosis not present

## 2019-05-30 ENCOUNTER — Other Ambulatory Visit: Payer: Self-pay | Admitting: Physician Assistant

## 2019-05-30 DIAGNOSIS — R928 Other abnormal and inconclusive findings on diagnostic imaging of breast: Secondary | ICD-10-CM

## 2019-06-06 ENCOUNTER — Inpatient Hospital Stay: Admission: RE | Admit: 2019-06-06 | Payer: BLUE CROSS/BLUE SHIELD | Source: Ambulatory Visit

## 2019-06-06 ENCOUNTER — Other Ambulatory Visit: Payer: Self-pay

## 2019-06-06 ENCOUNTER — Telehealth: Payer: Self-pay

## 2019-06-06 DIAGNOSIS — R928 Other abnormal and inconclusive findings on diagnostic imaging of breast: Secondary | ICD-10-CM

## 2019-06-06 DIAGNOSIS — N6001 Solitary cyst of right breast: Secondary | ICD-10-CM | POA: Diagnosis not present

## 2019-06-06 NOTE — Telephone Encounter (Signed)
[facility] called and states that they are working patient in today for additional views and ultrasound if needed.  Orders printed, sign by provider, and faxed over. MPulliam, CMA/RT(R)

## 2019-06-09 ENCOUNTER — Telehealth: Payer: Self-pay

## 2019-06-09 NOTE — Telephone Encounter (Signed)
Patient is having a breast biopsy on 06/12/2019 and per Gwyndolyn Kaufman, PA patient is to stop Aspirin for 3 days before the procedure and one day after the procedure - patient is to stop medication today through Friday and may restart on Saturday 06/14/19. Called and left patient a message and she returned call and was notified.  Patient states that she has not taken her dose of Aspirin today as advised on Friday at mammography dept. MPulliam, CMA/RT(R)

## 2019-06-12 DIAGNOSIS — N6001 Solitary cyst of right breast: Secondary | ICD-10-CM | POA: Diagnosis not present

## 2019-06-12 DIAGNOSIS — N63 Unspecified lump in unspecified breast: Secondary | ICD-10-CM | POA: Diagnosis not present

## 2019-12-03 ENCOUNTER — Ambulatory Visit: Payer: BC Managed Care – PPO | Admitting: Physician Assistant

## 2019-12-03 DIAGNOSIS — D485 Neoplasm of uncertain behavior of skin: Secondary | ICD-10-CM | POA: Diagnosis not present

## 2019-12-03 DIAGNOSIS — L820 Inflamed seborrheic keratosis: Secondary | ICD-10-CM | POA: Diagnosis not present

## 2019-12-03 DIAGNOSIS — L821 Other seborrheic keratosis: Secondary | ICD-10-CM | POA: Diagnosis not present

## 2019-12-03 DIAGNOSIS — D225 Melanocytic nevi of trunk: Secondary | ICD-10-CM | POA: Diagnosis not present

## 2019-12-03 DIAGNOSIS — D2239 Melanocytic nevi of other parts of face: Secondary | ICD-10-CM | POA: Diagnosis not present

## 2019-12-04 ENCOUNTER — Other Ambulatory Visit: Payer: Self-pay

## 2019-12-04 ENCOUNTER — Encounter: Payer: Self-pay | Admitting: Family

## 2019-12-04 ENCOUNTER — Ambulatory Visit: Payer: BC Managed Care – PPO | Admitting: Family

## 2019-12-04 VITALS — BP 115/77 | HR 80 | Temp 98.6°F | Ht 63.0 in | Wt 120.4 lb

## 2019-12-04 DIAGNOSIS — F411 Generalized anxiety disorder: Secondary | ICD-10-CM

## 2019-12-04 DIAGNOSIS — F320 Major depressive disorder, single episode, mild: Secondary | ICD-10-CM | POA: Diagnosis not present

## 2019-12-04 MED ORDER — BUPROPION HCL ER (XL) 150 MG PO TB24: 150.0000 mg | ORAL_TABLET | Freq: Every day | ORAL | 1 refills | Status: DC

## 2019-12-04 NOTE — Patient Instructions (Signed)

## 2019-12-04 NOTE — Progress Notes (Signed)
Subjective:    Patient ID: [name], female    DOB: 09/11/1966, 54 y.o.   MRN: [id number]  Chief Complaint  Patient presents with  . Depression    Discuss Wellbutrin going back on it. Lossing Job 12/11/19  . Anxiety   Pt presents to the office today to discuss restarted Wellbutrin. She reports she stopped this about 3 months ago, but has started feeling more anxious and downed.  Depression        This is a chronic problem.  The current episode started more than 1 year ago.   The onset quality is gradual.   The problem occurs intermittently.  The problem has been waxing and waning since onset.  Associated symptoms include irritable, restlessness, decreased interest and sad.  Associated symptoms include no helplessness and no hopelessness.     The symptoms are aggravated by nothing.  Past treatments include nothing.  Past medical history includes anxiety.   Anxiety Presents for follow-up visit. Symptoms include depressed mood, excessive worry, irritability, nervous/anxious behavior, panic and restlessness. Symptoms occur occasionally.        Review of Systems  Constitutional: Positive for irritability.  Psychiatric/Behavioral: Positive for depression. The patient is nervous/anxious.   All other systems reviewed and are negative.      Objective:   Physical Exam Vitals reviewed.  Constitutional:      General: She is irritable. She is not in acute distress.    Appearance: She is well-developed.  HENT:     Head: Normocephalic and atraumatic.     Right Ear: Tympanic membrane normal.     Left Ear: Tympanic membrane normal.  Eyes:     Pupils: Pupils are equal, round, and reactive to light.  Neck:     Thyroid: No thyromegaly.  Cardiovascular:     Rate and Rhythm: Normal rate and regular rhythm.     Heart sounds: Normal heart sounds. No murmur.  Pulmonary:     Effort: Pulmonary effort is normal. No respiratory distress.     Breath sounds: Normal breath sounds. No  wheezing.  Abdominal:     General: Bowel sounds are normal. There is no distension.     Palpations: Abdomen is soft.     Tenderness: There is no abdominal tenderness.  Musculoskeletal:        General: No tenderness. Normal range of motion.     Cervical back: Normal range of motion and neck supple.  Skin:    General: Skin is warm and dry.  Neurological:     Mental Status: She is alert and oriented to person, place, and time.     Cranial Nerves: No cranial nerve deficit.     Deep Tendon Reflexes: Reflexes are normal and symmetric.  Psychiatric:        Behavior: Behavior normal.        Thought Content: Thought content normal.        Judgment: Judgment normal.       BP 115/77   Pulse 80   Temp 98.6 F (37 C) (Temporal)   Ht 5\' 3"  (1.6 m)   Wt 120 lb 6.4 oz (54.6 kg)   SpO2 100%   BMI 21.33 kg/m      Assessment & Plan:  [name] comes in today with chief complaint of Depression (Discuss Wellbutrin going back on it. Lossing Job 12/11/19) and Anxiety   Diagnosis and orders addressed:  1. Depression, major, single episode, mild (HCC) Will start Wellbutrin 150 mg today  Stress management  - buPROPion (WELLBUTRIN XL) 150 MG 24 hr tablet; Take 1 tablet (150 mg total) by mouth daily.  Dispense: 90 tablet; Refill: 1  2. GAD (generalized anxiety disorder) - buPROPion (WELLBUTRIN XL) 150 MG 24 hr tablet; Take 1 tablet (150 mg total) by mouth daily.  Dispense: 90 tablet; Refill: 1   Follow up plan: 6 weeks    Evelina Dun, FNP

## 2020-06-08 ENCOUNTER — Other Ambulatory Visit: Payer: Self-pay | Admitting: Family

## 2020-06-08 DIAGNOSIS — I10 Essential (primary) hypertension: Secondary | ICD-10-CM

## 2020-06-08 DIAGNOSIS — F320 Major depressive disorder, single episode, mild: Secondary | ICD-10-CM

## 2020-06-08 DIAGNOSIS — F411 Generalized anxiety disorder: Secondary | ICD-10-CM

## 2020-07-12 ENCOUNTER — Other Ambulatory Visit: Payer: Self-pay | Admitting: Family

## 2020-07-12 DIAGNOSIS — F411 Generalized anxiety disorder: Secondary | ICD-10-CM

## 2020-07-12 DIAGNOSIS — F320 Major depressive disorder, single episode, mild: Secondary | ICD-10-CM

## 2020-07-12 NOTE — Telephone Encounter (Signed)
[place]. NTBS 30 days given 06/09/20

## 2020-09-15 ENCOUNTER — Other Ambulatory Visit: Payer: Self-pay | Admitting: Family

## 2020-09-15 DIAGNOSIS — I10 Essential (primary) hypertension: Secondary | ICD-10-CM

## 2020-10-06 ENCOUNTER — Ambulatory Visit: Payer: BC Managed Care – PPO | Admitting: Nurse Practitioner

## 2020-10-08 ENCOUNTER — Ambulatory Visit (INDEPENDENT_AMBULATORY_CARE_PROVIDER_SITE_OTHER): Payer: No Typology Code available for payment source | Admitting: Nurse Practitioner

## 2020-10-08 ENCOUNTER — Encounter: Payer: Self-pay | Admitting: Nurse Practitioner

## 2020-10-08 ENCOUNTER — Other Ambulatory Visit: Payer: Self-pay

## 2020-10-08 VITALS — BP 103/68 | HR 90 | Temp 98.0°F | Ht 63.0 in | Wt 124.2 lb

## 2020-10-08 DIAGNOSIS — F320 Major depressive disorder, single episode, mild: Secondary | ICD-10-CM | POA: Diagnosis not present

## 2020-10-08 DIAGNOSIS — F411 Generalized anxiety disorder: Secondary | ICD-10-CM | POA: Insufficient documentation

## 2020-10-08 DIAGNOSIS — R197 Diarrhea, unspecified: Secondary | ICD-10-CM | POA: Diagnosis not present

## 2020-10-08 MED ORDER — LOPERAMIDE HCL 2 MG PO TABS: 2.0000 mg | ORAL_TABLET | Freq: Four times a day (QID) | ORAL | 0 refills | Status: DC | PRN

## 2020-10-08 MED ORDER — BUPROPION HCL ER (XL) 150 MG PO TB24: 150.0000 mg | ORAL_TABLET | Freq: Every day | ORAL | 1 refills | Status: DC

## 2020-10-08 NOTE — Assessment & Plan Note (Signed)
Worsening anxiety with uncontrolled symptoms.  Re-started patient on Wellbutrin, follow-up in 6 weeks

## 2020-10-08 NOTE — Progress Notes (Signed)
Acute Office Visit  Subjective:    Patient ID: DANNISHA ECKMANN, female    DOB: May 10, 1966, 55 y.o.   MRN: [id number]  Chief Complaint  Patient presents with  . GI Problem    Stomach pain, bloating, and diarrhea 2-3 months    Abdominal Pain This is a recurrent problem. The current episode started more than 1 month ago. The onset quality is gradual. The problem occurs constantly. The problem has been unchanged. The pain is moderate. The quality of the pain is aching. The abdominal pain does not radiate. Associated symptoms include diarrhea. Pertinent negatives include no constipation, fever or vomiting. The pain is aggravated by bowel movement.   Depression: Patient complains of depression. She complains of depressed mood. Onset was approximately 2 years ago, gradually worsening since that time.  She denies current suicidal and homicidal plan or intent.   Family history significant for no psychiatric illness.Possible organic causes contributing are: none.  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  Anxiety: Patient complains of anxiety disorder.  She has the following symptoms: difficulty concentrating, feelings of losing control. Onset of symptoms was approximately 2 years ago, gradually worsening since that time. She denies current suicidal and homicidal ideation. Family history significant for no psychiatric illness.Possible organic causes contributing are: none. Risk factors: negative life event recent fathers death/job closing after 25 years Previous treatment includes Wellbutrin  She complains of the following side effects from the treatment: none.   Past Medical History:  Diagnosis Date  . Carotid artery dissection (HCC)   . Cervical radiculopathy   . Low back pain   . Lumbar radiculopathy   . Migraine headache   . Neck pain      Past Surgical History:  Procedure Laterality Date  . BACK SURGERY    . CERVICAL FUSION      Family History  Problem Relation Age of Onset  . Stroke Paternal Grandfather   . Hypertension Father   . CAD Father   . Stroke Father   . Hypertension Mother   . Heart murmur Sister   . Heart failure Maternal Grandmother     Social History   Socioeconomic History  . Marital status: Divorced    Spouse name: Not on file  . Number of children: Not on file  . Years of education: Not on file  . Highest education level: Not on file  Occupational History  . Not on file  Tobacco Use  . Smoking status: Never Smoker  . Smokeless tobacco: Never Used  Vaping Use  . Vaping Use: Never used  Substance and Sexual Activity  . Alcohol use: No    Alcohol/week: 0.0 standard drinks  . Drug use: No  . Sexual activity: Not on file  Other Topics Concern  . Not on file  Social History Narrative  . Not on file   Social Determinants of Health   Financial Resource Strain: Not on file  Food Insecurity: Not on file  Transportation Needs: Not on file  Physical Activity: Not on file  Stress: Not on file  Social Connections: Not on file  Intimate Partner Violence: Not on file    Outpatient Medications Prior to Visit  Medication Sig Dispense Refill  . aspirin 81 MG chewable tablet Chew by mouth daily.    Marland Kitchen lisinopril (ZESTRIL) 10 MG tablet Take 1 tablet (10 mg total)  by mouth daily. (Needs to be seen before next refill) 30 tablet 0  . Multiple Vitamins-Minerals (MULTI ADULT GUMMIES PO) Take by mouth daily.    . SUMAtriptan (IMITREX) 50 MG tablet TAKE 1 TABLET AS NEEDED AS DIRECTED 27 tablet 2  . acetaminophen (TYLENOL) 325 MG tablet Take 650 mg by mouth every 6 (six) hours as needed.    . fluticasone (FLONASE) 50 MCG/ACT nasal spray Place 2 sprays into both nostrils daily. 48 g 3  . buPROPion (WELLBUTRIN XL) 150 MG 24 hr tablet Take 1 tablet (150 mg total) by mouth daily. (Needs to be seen  before next refill) (Patient not taking: Reported on 10/08/2020) 30 tablet 0   No facility-administered medications prior to visit.    No Known Allergies  Review of Systems  Constitutional: Negative for fever.  HENT: Negative.   Eyes: Negative.   Respiratory: Negative.   Gastrointestinal: Positive for abdominal pain and diarrhea. Negative for constipation and vomiting.  Genitourinary: Negative.   Skin: Negative.   Neurological: Negative.   Psychiatric/Behavioral: Negative for self-injury, sleep disturbance and suicidal ideas. The patient is nervous/anxious.   All other systems reviewed and are negative.      Objective:    Physical Exam Vitals reviewed.  HENT:     Head: Normocephalic.     Mouth/Throat:     Mouth: Mucous membranes are moist.     Pharynx: Oropharynx is clear.  Eyes:     Conjunctiva/sclera: Conjunctivae normal.  Cardiovascular:     Rate and Rhythm: Normal rate and regular rhythm.     Pulses: Normal pulses.     Heart sounds: Normal heart sounds.  Pulmonary:     Effort: Pulmonary effort is normal.     Breath sounds: Normal breath sounds.  Abdominal:     General: Bowel sounds are normal.     Palpations: There is no mass.     Tenderness: There is abdominal tenderness. There is no right CVA tenderness, left CVA tenderness, guarding or rebound.  Skin:    General: Skin is warm.  Neurological:     Mental Status: She is alert and oriented to person, place, and time.  Psychiatric:     Comments: Anxiety/depression     BP 103/68   Pulse 90   Temp 98 F (36.7 C)   Ht 5\' 3"  (1.6 m)   Wt 124 lb 3.2 oz (56.3 kg)   SpO2 96%   BMI 22.00 kg/m  Wt Readings from Last 3 Encounters:  10/08/20 124 lb 3.2 oz (56.3 kg)  12/04/19 120 lb 6.4 oz (54.6 kg)  06/26/18 120 lb 3.2 oz (54.5 kg)    Health Maintenance Due  Topic Date Due  . Hepatitis C Screening  Never done  . HIV Screening  Never done  . PAP SMEAR-Modifier  Never done  . COLONOSCOPY (Pts 45-43yrs  Insurance coverage will need to be confirmed)  Never done  . INFLUENZA VACCINE  04/11/2020  . COVID-19 Vaccine (3 - Booster for Pfizer series) 07/15/2020      Lab Results  Component Value Date   TSH 1.030 05/15/2019   Lab Results  Component Value Date   WBC 3.8 05/15/2019   HGB 14.5 05/15/2019   HCT 42.9 05/15/2019   MCV 98 (H) 05/15/2019   PLT 270 05/15/2019   Lab Results  Component Value Date   NA 139 05/15/2019   K 4.4 05/15/2019   CO2 24 05/15/2019   GLUCOSE 85 05/15/2019   BUN 15 05/15/2019  CREATININE 0.84 05/15/2019   BILITOT 0.7 05/15/2019   ALKPHOS 62 05/15/2019   AST 18 05/15/2019   ALT 10 05/15/2019   PROT 6.5 05/15/2019   ALBUMIN 4.4 05/15/2019   CALCIUM 9.3 05/15/2019   ANIONGAP 8 06/25/2015   Lab Results  Component Value Date   CHOL 160 05/15/2019   Lab Results  Component Value Date   HDL 63 05/15/2019   Lab Results  Component Value Date   LDLCALC 88 05/15/2019   Lab Results  Component Value Date   TRIG 41 05/15/2019   Lab Results  Component Value Date   CHOLHDL 2.5 05/15/2019   No results found for: HGBA1C     Assessment & Plan:   Problem List Items Addressed This Visit      Other   Depression, major, single episode, mild (HCC) - Primary    Symptoms of depression not well controlled.  Patient recently experienced a loss of her father, and the closing of her job for 25 years.  Patient stopped taking Wellbutrin about a month ago.  After assessment completed PHQ-9, GAD-7.  Patient will restart Wellbutrin and follow-up in 6 weeks.  Provided education with printed handouts given. Rx sent to pharmacy.      Relevant Medications   buPROPion (WELLBUTRIN XL) 150 MG 24 hr tablet   GAD (generalized anxiety disorder)    Worsening anxiety with uncontrolled symptoms.  Re-started patient on Wellbutrin, follow-up in 6 weeks      Relevant Medications   buPROPion (WELLBUTRIN XL) 150 MG 24 hr tablet   Diarrhea    Patient is reporting abdominal  pain and diarrhea the last couple of weeks.  Patient is experiencing a lot of stress due to caring for her sick father who recently passed away.  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  Patient has not changed her eating habits or eating any thing to cause her to be sick.  Patient is experiencing bloating, generalized abdominal pain and diarrhea daily.  Patient will schedule a complete physical with labs in the next 6 weeks.  Education provided to patient with printed handouts given. Started patient on Imodium. Rx sent to pharmacy Follow-up with worsening or unresolved symptoms.        GAD 7 : Generalized Anxiety Score 10/08/2020 12/04/2019  Nervous, Anxious, on Edge 2 2  Control/stop worrying 3 3  Worry too much - different things 3 3  Trouble relaxing 3 3  Restless 3 0  Easily annoyed or irritable 2 1  Afraid - awful might happen 2 1  Total GAD 7 Score 18 13  Anxiety Difficulty Not difficult at all Not difficult at all   [facility] Visit from 10/08/2020 in [facility]  PHQ-9 Total Score 11      Meds ordered this encounter  Medications  . buPROPion (WELLBUTRIN XL) 150 MG 24 hr tablet    Sig: Take 1 tablet (150 mg total) by mouth daily. (Needs to be seen before next refill)    Dispense:  30 tablet    Refill:  1    Order Specific Question:   Supervising Provider    Answer:   Raliegh Ip [[id number]]  . loperamide (IMODIUM A-D) 2 MG tablet    Sig: Take 1 tablet (2 mg total) by mouth 4 (four) times daily as needed for diarrhea or loose stools.    Dispense:  30 tablet    Refill:  0  Order Specific Question:   Supervising Provider    Answer:   Janora Norlander [[id number]]     Ivy Lynn, NP

## 2020-10-08 NOTE — Assessment & Plan Note (Signed)
Symptoms of depression not well controlled.  Patient recently experienced a loss of her father, and the closing of her job for 25 years.  Patient stopped taking Wellbutrin about a month ago.  After assessment completed PHQ-9, GAD-7.  Patient will restart Wellbutrin and follow-up in 6 weeks.  Provided education with printed handouts given. Rx sent to pharmacy.

## 2020-10-08 NOTE — Patient Instructions (Addendum)
[email or web address].[email or web address]">  Generalized Anxiety Disorder, Adult Generalized anxiety disorder (GAD) is a mental health condition. Unlike normal worries, anxiety related to GAD is not triggered by a specific event. These worries do not fade or get better with time. GAD interferes with relationships, work, and school. GAD symptoms can vary from mild to severe. People with severe GAD can have intense waves of anxiety with physical symptoms that are similar to panic attacks. What are the causes? The exact cause of GAD is not known, but the following are believed to have an impact:  Differences in natural brain chemicals.  Genes passed down from parents to children.  Differences in the way threats are perceived.  Development during childhood.  Personality. What increases the risk? The following factors may make you more likely to develop this condition:  Being female.  Having a family history of anxiety disorders.  Being very shy.  Experiencing very stressful life events, such as the death of a loved one.  Having a very stressful family environment. What are the signs or symptoms? People with GAD often worry excessively about many things in their lives, such as their health and family. Symptoms may also include:  Mental and emotional symptoms: ? Worrying excessively about natural disasters. ? Fear of being late. ? Difficulty concentrating. ? Fears that others are judging your performance.  Physical symptoms: ? Fatigue. ? Headaches, muscle tension, muscle twitches, trembling, or feeling shaky. ? Feeling like your heart is pounding or beating very fast. ? Feeling out of breath or like you cannot take a deep breath. ? Having trouble falling asleep or staying asleep, or experiencing restlessness. ? Sweating. ? Nausea, diarrhea, or irritable bowel syndrome (IBS).  Behavioral symptoms: ? Experiencing erratic moods or irritability. ? Avoidance of new situations. ? Avoidance of  people. ? Extreme difficulty making decisions. How is this diagnosed? This condition is diagnosed based on your symptoms and medical history. You will also have a physical exam. Your health care provider may perform tests to rule out other possible causes of your symptoms. To be diagnosed with GAD, a person must have anxiety that:  Is out of his or her control.  Affects several different aspects of his or her life, such as work and relationships.  Causes distress that makes him or her unable to take part in normal activities.  Includes at least three symptoms of GAD, such as restlessness, fatigue, trouble concentrating, irritability, muscle tension, or sleep problems. Before your health care provider can confirm a diagnosis of GAD, these symptoms must be present more days than they are not, and they must last for 6 months or longer. How is this treated? This condition may be treated with:  Medicine. Antidepressant medicine is usually prescribed for long-term daily control. Anti-anxiety medicines may be added in severe cases, especially when panic attacks occur.  Talk therapy (psychotherapy). Certain types of talk therapy can be helpful in treating GAD by providing support, education, and guidance. Options include: ? Cognitive behavioral therapy (CBT). People learn coping skills and self-calming techniques to ease their physical symptoms. They learn to identify unrealistic thoughts and behaviors and to replace them with more appropriate thoughts and behaviors. ? Acceptance and commitment therapy (ACT). This treatment teaches people how to be mindful as a way to cope with unwanted thoughts and feelings. ? Biofeedback. This process trains you to manage your body's response (physiological response) through breathing techniques and relaxation methods. You will work with a therapist while machines are used to monitor your physical   symptoms.  Stress management techniques. These include yoga,  meditation, and exercise. A mental health specialist can help determine which treatment is best for you. Some people see improvement with one type of therapy. However, other people require a combination of therapies.   Follow these instructions at home: Lifestyle  Maintain a consistent routine and schedule.  Anticipate stressful situations. Create a plan, and allow extra time to work with your plan.  Practice stress management or self-calming techniques that you have learned from your therapist or your health care provider. General instructions  Take over-the-counter and prescription medicines only as told by your health care provider.  Understand that you are likely to have setbacks. Accept this and be kind to yourself as you persist to take better care of yourself.  Recognize and accept your accomplishments, even if you judge them as small.  Keep all follow-up visits as told by your health care provider. This is important. Contact a health care provider if:  Your symptoms do not get better.  Your symptoms get worse.  You have signs of depression, such as: ? A persistently sad or irritable mood. ? Loss of enjoyment in activities that used to bring you joy. ? Change in weight or eating. ? Changes in sleeping habits. ? Avoiding friends or family members. ? Loss of energy for normal tasks. ? Feelings of guilt or worthlessness. Get help right away if:  You have serious thoughts about hurting yourself or others. If you ever feel like you may hurt yourself or others, or have thoughts about taking your own life, get help right away. Go to your nearest emergency department or:  Call your local emergency services (911 in the [place]  Call a suicide crisis helpline, such as the National Suicide Prevention Lifeline at 705-454-2304. This is open 24 hours a day in the [place]  Text the Crisis Text Line at 612-723-7312 (in the [place] Summary  Generalized anxiety disorder (GAD) is a mental  health condition that involves worry that is not triggered by a specific event.  People with GAD often worry excessively about many things in their lives, such as their health and family.  GAD may cause symptoms such as restlessness, trouble concentrating, sleep problems, frequent sweating, nausea, diarrhea, headaches, and trembling or muscle twitching.  A mental health specialist can help determine which treatment is best for you. Some people see improvement with one type of therapy. However, other people require a combination of therapies. This information is not intended to replace advice given to you by your health care provider. Make sure you discuss any questions you have with your health care provider. Document Revised: 06/18/2019 Document Reviewed: 06/18/2019 Elsevier Patient Education  2021 [employer] Diarrhea, Adult Diarrhea is when you pass loose and watery poop (stool) often. Diarrhea can make you feel weak and cause you to lose water in your body (get dehydrated). Losing water in your body can cause you to:  Feel tired and thirsty.  Have a dry mouth.  Go pee (urinate) less often. Diarrhea often lasts 2-3 days. However, it can last longer if it is a sign of something more serious. It is important to treat your diarrhea as told by your doctor. Follow these instructions at home: Eating and drinking Follow these instructions as told by your doctor:  Take an ORS (oral rehydration solution). This is a drink that helps you replace fluids and minerals your body lost. It is sold at pharmacies and stores.  Drink plenty of fluids, such  as: ? Water. ? Ice chips. ? Diluted fruit juice. ? Low-calorie sports drinks. ? Milk, if you want.  Avoid drinking fluids that have a lot of sugar or caffeine in them.  Eat bland, easy-to-digest foods in small amounts as you are able. These foods include: ? Bananas. ? Applesauce. ? Rice. ? Low-fat (lean)  meats. ? Toast. ? Crackers.  Avoid alcohol.  Avoid spicy or fatty foods.      Medicines  Take over-the-counter and prescription medicines only as told by your doctor.  If you were prescribed an antibiotic medicine, take it as told by your doctor. Do not stop using the antibiotic even if you start to feel better. General instructions  Wash your hands often using soap and water. If soap and water are not available, use a hand sanitizer. Others in your home should wash their hands as well. Hands should be washed: ? After using the toilet or changing a diaper. ? Before preparing, cooking, or serving food. ? While caring for a sick person. ? While visiting someone in a hospital.  Drink enough fluid to keep your pee (urine) pale yellow.  Rest at home while you get better.  Watch your condition for any changes.  Take a warm bath to help with any burning or pain from having diarrhea.  Keep all follow-up visits as told by your doctor. This is important.   Contact a doctor if:  You have a fever.  Your diarrhea gets worse.  You have new symptoms.  You cannot keep fluids down.  You feel light-headed or dizzy.  You have a headache.  You have muscle cramps. Get help right away if:  You have chest pain.  You feel very weak or you pass out (faint).  You have bloody or black poop or poop that looks like tar.  You have very bad pain, cramping, or bloating in your belly (abdomen).  You have trouble breathing or you are breathing very quickly.  Your heart is beating very quickly.  Your skin feels cold and clammy.  You feel confused.  You have signs of losing too much water in your body, such as: ? Dark pee, very little pee, or no pee. ? Cracked lips. ? Dry mouth. ? Sunken eyes. ? Sleepiness. ? Weakness. Summary  Diarrhea is when you pass loose and watery poop (stool) often.  Diarrhea can make you feel weak and cause you to lose water in your body (get  dehydrated).  Take an ORS (oral rehydration solution). This is a drink that is sold at pharmacies and stores.  Eat bland, easy-to-digest foods in small amounts as you are able.  Contact a doctor if your condition gets worse. Get help right away if you have signs that you have lost too much water in your body. This information is not intended to replace advice given to you by your health care provider. Make sure you discuss any questions you have with your health care provider. Document Revised: 02/01/2018 Document Reviewed: 02/01/2018 Elsevier Patient Education  2021 [employer].

## 2020-10-08 NOTE — Assessment & Plan Note (Signed)
Patient is reporting abdominal pain and diarrhea the last couple of weeks.  Patient is experiencing a lot of stress due to caring for her sick father who recently passed away.  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  Patient has not changed her eating habits or eating any thing to cause her to be sick.  Patient is experiencing bloating, generalized abdominal pain and diarrhea daily.  Patient will schedule a complete physical with labs in the next 6 weeks.  Education provided to patient with printed handouts given. Started patient on Imodium. Rx sent to pharmacy Follow-up with worsening or unresolved symptoms.

## 2020-10-28 ENCOUNTER — Other Ambulatory Visit: Payer: Self-pay

## 2020-10-28 ENCOUNTER — Ambulatory Visit (INDEPENDENT_AMBULATORY_CARE_PROVIDER_SITE_OTHER): Payer: No Typology Code available for payment source | Admitting: Family

## 2020-10-28 ENCOUNTER — Encounter: Payer: Self-pay | Admitting: Family

## 2020-10-28 VITALS — BP 111/73 | HR 85 | Temp 97.7°F | Ht 63.0 in | Wt 120.6 lb

## 2020-10-28 DIAGNOSIS — G43809 Other migraine, not intractable, without status migrainosus: Secondary | ICD-10-CM

## 2020-10-28 DIAGNOSIS — Z1211 Encounter for screening for malignant neoplasm of colon: Secondary | ICD-10-CM

## 2020-10-28 DIAGNOSIS — I10 Essential (primary) hypertension: Secondary | ICD-10-CM

## 2020-10-28 DIAGNOSIS — Z0001 Encounter for general adult medical examination with abnormal findings: Secondary | ICD-10-CM

## 2020-10-28 DIAGNOSIS — Z114 Encounter for screening for human immunodeficiency virus [HIV]: Secondary | ICD-10-CM

## 2020-10-28 DIAGNOSIS — F411 Generalized anxiety disorder: Secondary | ICD-10-CM | POA: Diagnosis not present

## 2020-10-28 DIAGNOSIS — Z1159 Encounter for screening for other viral diseases: Secondary | ICD-10-CM

## 2020-10-28 DIAGNOSIS — Z8679 Personal history of other diseases of the circulatory system: Secondary | ICD-10-CM

## 2020-10-28 DIAGNOSIS — F320 Major depressive disorder, single episode, mild: Secondary | ICD-10-CM

## 2020-10-28 DIAGNOSIS — Z0000 Encounter for general adult medical examination without abnormal findings: Secondary | ICD-10-CM

## 2020-10-28 MED ORDER — SUMATRIPTAN SUCCINATE 50 MG PO TABS: ORAL_TABLET | ORAL | 2 refills | Status: AC

## 2020-10-28 MED ORDER — LISINOPRIL 10 MG PO TABS: 10.0000 mg | ORAL_TABLET | Freq: Every day | ORAL | 3 refills | Status: DC

## 2020-10-28 NOTE — Patient Instructions (Signed)
Health Maintenance, Female Adopting a healthy lifestyle and getting preventive care are important in promoting health and wellness. Ask your health care provider about:  The right schedule for you to have regular tests and exams.  Things you can do on your own to prevent diseases and keep yourself healthy. What should I know about diet, weight, and exercise? Eat a healthy diet  Eat a diet that includes plenty of vegetables, fruits, low-fat dairy products, and lean protein.  Do not eat a lot of foods that are high in solid fats, added sugars, or sodium.   Maintain a healthy weight Body mass index (BMI) is used to identify weight problems. It estimates body fat based on height and weight. Your health care provider can help determine your BMI and help you achieve or maintain a healthy weight. Get regular exercise Get regular exercise. This is one of the most important things you can do for your health. Most adults should:  Exercise for at least 150 minutes each week. The exercise should increase your heart rate and make you sweat (moderate-intensity exercise).  Do strengthening exercises at least twice a week. This is in addition to the moderate-intensity exercise.  Spend less time sitting. Even light physical activity can be beneficial. Watch cholesterol and blood lipids Have your blood tested for lipids and cholesterol at 54 years of age, then have this test every 5 years. Have your cholesterol levels checked more often if:  Your lipid or cholesterol levels are high.  You are older than 54 years of age.  You are at high risk for heart disease. What should I know about cancer screening? Depending on your health history and family history, you may need to have cancer screening at various ages. This may include screening for:  Breast cancer.  Cervical cancer.  Colorectal cancer.  Skin cancer.  Lung cancer. What should I know about heart disease, diabetes, and high blood  pressure? Blood pressure and heart disease  High blood pressure causes heart disease and increases the risk of stroke. This is more likely to develop in people who have high blood pressure readings, are of African descent, or are overweight.  Have your blood pressure checked: ? Every 3-5 years if you are 18-39 years of age. ? Every year if you are 40 years old or older. Diabetes Have regular diabetes screenings. This checks your fasting blood sugar level. Have the screening done:  Once every three years after age 40 if you are at a normal weight and have a low risk for diabetes.  More often and at a younger age if you are overweight or have a high risk for diabetes. What should I know about preventing infection? Hepatitis B If you have a higher risk for hepatitis B, you should be screened for this virus. Talk with your health care provider to find out if you are at risk for hepatitis B infection. Hepatitis C Testing is recommended for:  Everyone born from 1945 through 1965.  Anyone with known risk factors for hepatitis C. Sexually transmitted infections (STIs)  Get screened for STIs, including gonorrhea and chlamydia, if: ? You are sexually active and are younger than 54 years of age. ? You are older than 54 years of age and your health care provider tells you that you are at risk for this type of infection. ? Your sexual activity has changed since you were last screened, and you are at increased risk for chlamydia or gonorrhea. Ask your health care provider   if you are at risk.  Ask your health care provider about whether you are at high risk for HIV. Your health care provider may recommend a prescription medicine to help prevent HIV infection. If you choose to take medicine to prevent HIV, you should first get tested for HIV. You should then be tested every 3 months for as long as you are taking the medicine. Pregnancy  If you are about to stop having your period (premenopausal) and  you may become pregnant, seek counseling before you get pregnant.  Take 400 to 800 micrograms (mcg) of folic acid every day if you become pregnant.  Ask for birth control (contraception) if you want to prevent pregnancy. Osteoporosis and menopause Osteoporosis is a disease in which the bones lose minerals and strength with aging. This can result in bone fractures. If you are 65 years old or older, or if you are at risk for osteoporosis and fractures, ask your health care provider if you should:  Be screened for bone loss.  Take a calcium or vitamin D supplement to lower your risk of fractures.  Be given hormone replacement therapy (HRT) to treat symptoms of menopause. Follow these instructions at home: Lifestyle  Do not use any products that contain nicotine or tobacco, such as cigarettes, e-cigarettes, and chewing tobacco. If you need help quitting, ask your health care provider.  Do not use street drugs.  Do not share needles.  Ask your health care provider for help if you need support or information about quitting drugs. Alcohol use  Do not drink alcohol if: ? Your health care provider tells you not to drink. ? You are pregnant, may be pregnant, or are planning to become pregnant.  If you drink alcohol: ? Limit how much you use to 0-1 drink a day. ? Limit intake if you are breastfeeding.  Be aware of how much alcohol is in your drink. In the [place] one drink equals one 12 oz bottle of beer (355 mL), one 5 oz glass of wine (148 mL), or one 1 oz glass of hard liquor (44 mL). General instructions  Schedule regular health, dental, and eye exams.  Stay current with your vaccines.  Tell your health care provider if: ? You often feel depressed. ? You have ever been abused or do not feel safe at home. Summary  Adopting a healthy lifestyle and getting preventive care are important in promoting health and wellness.  Follow your health care provider's instructions about healthy  diet, exercising, and getting tested or screened for diseases.  Follow your health care provider's instructions on monitoring your cholesterol and blood pressure. This information is not intended to replace advice given to you by your health care provider. Make sure you discuss any questions you have with your health care provider. Document Revised: 08/21/2018 Document Reviewed: 08/21/2018 Elsevier Patient Education  2021 Elsevier Inc.  

## 2020-10-28 NOTE — Progress Notes (Signed)
Subjective:    Patient ID: [name], female    DOB: 02/18/1966, 55 y.o.   MRN: [id number]  Chief Complaint  Patient presents with  . Medical Management of Chronic Issues   PT presents to the office today for CPE without pap and chronic follow up.  She reports she lost her dad last month that has been hard. She has a hx of carotid arthery dissection.  Hypertension This is a chronic problem. The current episode started more than 1 year ago. The problem has been resolved since onset. The problem is controlled. Associated symptoms include anxiety, headaches and malaise/fatigue. Pertinent negatives include no peripheral edema or shortness of breath. Risk factors for coronary artery disease include dyslipidemia. The current treatment provides moderate improvement.  Headache  This is a chronic problem. The current episode started in the past 7 days. The problem occurs intermittently. The pain is located in the left unilateral region. The pain quality is similar to prior headaches. The pain is mild. Pertinent negatives include no nausea, phonophobia or photophobia. She has tried NSAIDs for the symptoms. The treatment provided mild relief. Her past medical history is significant for hypertension.  Anxiety Presents for follow-up visit. Symptoms include depressed mood, excessive worry, irritability and restlessness. Patient reports no nausea or shortness of breath. Symptoms occur occasionally. The severity of symptoms is moderate.    Depression        This is a chronic problem.  The current episode started more than 1 year ago.   The onset quality is gradual.   The problem occurs intermittently.  Associated symptoms include irritable, restlessness, headaches and sad.  Associated symptoms include no helplessness and no hopelessness.  Past medical history includes anxiety.       Review of Systems  Constitutional: Positive for irritability and malaise/fatigue.  Eyes: Negative for photophobia.   Respiratory: Negative for shortness of breath.   Gastrointestinal: Negative for nausea.  Neurological: Positive for headaches.  Psychiatric/Behavioral: Positive for depression.  All other systems reviewed and are negative.  Family History  Problem Relation Age of Onset  . Stroke Paternal Grandfather   . Hypertension Father   . CAD Father   . Stroke Father   . Hypertension Mother   . Heart murmur Sister   . Heart failure Maternal Grandmother    Social History   Socioeconomic History  . Marital status: Divorced    Spouse name: Not on file  . Number of children: Not on file  . Years of education: Not on file  . Highest education level: Not on file  Occupational History  . Not on file  Tobacco Use  . Smoking status: Never Smoker  . Smokeless tobacco: Never Used  Vaping Use  . Vaping Use: Never used  Substance and Sexual Activity  . Alcohol use: No    Alcohol/week: 0.0 standard drinks  . Drug use: No  . Sexual activity: Not on file  Other Topics Concern  . Not on file  Social History Narrative  . Not on file   Social Determinants of Health   Financial Resource Strain: Not on file  Food Insecurity: Not on file  Transportation Needs: Not on file  Physical Activity: Not on file  Stress: Not on file  Social Connections: Not on file       Objective:   Physical Exam Vitals reviewed.  Constitutional:      General: She is irritable. She is not in acute distress.    Appearance: She is  well-developed and well-nourished.  HENT:     Head: Normocephalic and atraumatic.     Right Ear: Tympanic membrane normal.     Left Ear: Tympanic membrane normal.     Mouth/Throat:     Mouth: Oropharynx is clear and moist.  Eyes:     Pupils: Pupils are equal, round, and reactive to light.  Neck:     Thyroid: No thyromegaly.  Cardiovascular:     Rate and Rhythm: Normal rate and regular rhythm.     Pulses: Intact distal pulses.     Heart sounds: Normal heart sounds. No murmur  heard.   Pulmonary:     Effort: Pulmonary effort is normal. No respiratory distress.     Breath sounds: Normal breath sounds. No wheezing.  Abdominal:     General: Bowel sounds are normal. There is no distension.     Palpations: Abdomen is soft.     Tenderness: There is no abdominal tenderness.  Musculoskeletal:        General: No tenderness or edema. Normal range of motion.     Cervical back: Normal range of motion and neck supple.  Skin:    General: Skin is warm and dry.  Neurological:     Mental Status: She is alert and oriented to person, place, and time.     Cranial Nerves: No cranial nerve deficit.     Deep Tendon Reflexes: Reflexes are normal and symmetric.  Psychiatric:        Mood and Affect: Mood and affect normal.        Behavior: Behavior normal.        Thought Content: Thought content normal.        Judgment: Judgment normal.         BP 111/73   Pulse 85   Temp 97.7 F (36.5 C) (Temporal)   Ht _0  (1.6 m)   Wt 120 lb 9.6 oz (54.7 kg)   BMI 21.36 kg/m   Assessment & Plan:  Riley R Baldassari comes in today with chief complaint of Medical Management of Chronic Issues   Diagnosis and orders addressed:  1. Other migraine without status migrainosus, not intractable - SUMAtriptan (IMITREX) 50 MG tablet; TAKE 1 TABLET AS NEEDED AS DIRECTED  Dispense: 27 tablet; Refill: 2 - CMP14+EGFR - CBC with Differential/Platelet  2. Essential hypertension - lisinopril (ZESTRIL) 10 MG tablet; Take 1 tablet (10 mg total) by mouth daily. (Needs to be seen before next refill)  Dispense: 90 tablet; Refill: 3 - CMP14+EGFR - CBC with Differential/Platelet  3. Primary hypertension - CMP14+EGFR - CBC with Differential/Platelet  4. GAD (generalized anxiety disorder) - CMP14+EGFR - CBC with Differential/Platelet  5. Depression, major, single episode, mild (HCC) - CMP14+EGFR - CBC with Differential/Platelet  6. History of carotid artery dissection - CMP14+EGFR -  CBC with Differential/Platelet  7. Annual physical exam - Hepatitis C antibody - HIV Antibody (routine testing w rflx) - Cologuard - CMP14+EGFR - CBC with Differential/Platelet - Lipid panel - TSH  8. Need for hepatitis C screening test - Hepatitis C antibody - CMP14+EGFR - CBC with Differential/Platelet  9. Encounter for screening for HIV  - HIV Antibody (routine testing w rflx) - CMP14+EGFR - CBC with Differential/Platelet  10. Colon cancer screening - Cologuard - CMP14+EGFR - CBC with Differential/Platelet   Labs pending Health Maintenance reviewed Diet and exercise encouraged  Follow up plan: 6 months    Evelina Dun, FNP

## 2020-10-29 LAB — CMP14+EGFR
ALT: 10 IU/L (ref 0–32)
AST: 16 IU/L (ref 0–40)
Albumin/Globulin Ratio: 2 (ref 1.2–2.2)
Albumin: 4.5 g/dL (ref 3.8–4.9)
Alkaline Phosphatase: 90 IU/L (ref 44–121)
BUN/Creatinine Ratio: 19 (ref 9–23)
BUN: 14 mg/dL (ref 6–24)
Bilirubin Total: 0.6 mg/dL (ref 0.0–1.2)
CO2: 19 mmol/L — ABNORMAL LOW (ref 20–29)
Calcium: 9.6 mg/dL (ref 8.7–10.2)
Chloride: 102 mmol/L (ref 96–106)
Creatinine, Ser: 0.73 mg/dL (ref 0.57–1.00)
GFR calc Af Amer: 108 mL/min/{1.73_m2} (ref >59)
GFR calc non Af Amer: 94 mL/min/{1.73_m2} (ref >59)
Globulin, Total: 2.3 g/dL (ref 1.5–4.5)
Glucose: 84 mg/dL (ref 65–99)
Potassium: 4.6 mmol/L (ref 3.5–5.2)
Sodium: 140 mmol/L (ref 134–144)
Total Protein: 6.8 g/dL (ref 6.0–8.5)

## 2020-10-29 LAB — CBC WITH DIFFERENTIAL/PLATELET
Basophils Absolute: 0 10*3/uL (ref 0.0–0.2)
Basos: 1 %
EOS (ABSOLUTE): 0.1 10*3/uL (ref 0.0–0.4)
Eos: 2 %
Hematocrit: 42.2 % (ref 34.0–46.6)
Hemoglobin: 14.5 g/dL (ref 11.1–15.9)
Immature Grans (Abs): 0 10*3/uL (ref 0.0–0.1)
Immature Granulocytes: 0 %
Lymphocytes Absolute: 1.6 10*3/uL (ref 0.7–3.1)
Lymphs: 38 %
MCH: 32.8 pg (ref 26.6–33.0)
MCHC: 34.4 g/dL (ref 31.5–35.7)
MCV: 96 fL (ref 79–97)
Monocytes Absolute: 0.3 10*3/uL (ref 0.1–0.9)
Monocytes: 6 %
Neutrophils Absolute: 2.2 10*3/uL (ref 1.4–7.0)
Neutrophils: 53 %
Platelets: 261 10*3/uL (ref 150–450)
RBC: 4.42 x10E6/uL (ref 3.77–5.28)
RDW: 11.5 % — ABNORMAL LOW (ref 11.7–15.4)
WBC: 4.2 10*3/uL (ref 3.4–10.8)

## 2020-10-29 LAB — LIPID PANEL
Chol/HDL Ratio: 2.6 ratio (ref 0.0–4.4)
Cholesterol, Total: 153 mg/dL (ref 100–199)
HDL: 59 mg/dL (ref >39)
LDL Chol Calc (NIH): 81 mg/dL (ref 0–99)
Triglycerides: 63 mg/dL (ref 0–149)
VLDL Cholesterol Cal: 13 mg/dL (ref 5–40)

## 2020-10-29 LAB — TSH: TSH: 0.67 u[IU]/mL (ref 0.450–4.500)

## 2020-10-29 LAB — HEPATITIS C ANTIBODY: Hep C Virus Ab: <0.1 s/co ratio (ref 0.0–0.9)

## 2020-10-29 LAB — HIV ANTIBODY (ROUTINE TESTING W REFLEX): HIV Screen 4th Generation wRfx: NONREACTIVE

## 2020-12-13 ENCOUNTER — Other Ambulatory Visit: Payer: Self-pay

## 2020-12-13 DIAGNOSIS — F320 Major depressive disorder, single episode, mild: Secondary | ICD-10-CM

## 2020-12-13 DIAGNOSIS — F411 Generalized anxiety disorder: Secondary | ICD-10-CM

## 2020-12-13 MED ORDER — BUPROPION HCL ER (XL) 150 MG PO TB24: 150.0000 mg | ORAL_TABLET | Freq: Every day | ORAL | 2 refills | Status: DC

## 2021-01-10 ENCOUNTER — Encounter: Payer: Self-pay | Admitting: Family Medicine

## 2021-01-14 ENCOUNTER — Encounter: Payer: Self-pay | Admitting: Family Medicine

## 2021-03-16 ENCOUNTER — Other Ambulatory Visit: Payer: Self-pay | Admitting: Family

## 2021-03-16 DIAGNOSIS — F411 Generalized anxiety disorder: Secondary | ICD-10-CM

## 2021-03-16 DIAGNOSIS — F320 Major depressive disorder, single episode, mild: Secondary | ICD-10-CM

## 2021-07-25 ENCOUNTER — Other Ambulatory Visit: Payer: Self-pay | Admitting: Family

## 2021-07-25 DIAGNOSIS — F320 Major depressive disorder, single episode, mild: Secondary | ICD-10-CM

## 2021-07-25 DIAGNOSIS — F411 Generalized anxiety disorder: Secondary | ICD-10-CM

## 2021-08-26 ENCOUNTER — Other Ambulatory Visit: Payer: Self-pay | Admitting: Family

## 2021-08-26 DIAGNOSIS — F320 Major depressive disorder, single episode, mild: Secondary | ICD-10-CM

## 2021-08-26 DIAGNOSIS — I10 Essential (primary) hypertension: Secondary | ICD-10-CM

## 2021-08-26 DIAGNOSIS — F411 Generalized anxiety disorder: Secondary | ICD-10-CM

## 2021-09-09 ENCOUNTER — Other Ambulatory Visit: Payer: Self-pay | Admitting: Family

## 2021-09-09 DIAGNOSIS — Z1231 Encounter for screening mammogram for malignant neoplasm of breast: Secondary | ICD-10-CM

## 2021-09-16 ENCOUNTER — Encounter: Payer: Self-pay | Admitting: Family

## 2021-09-16 ENCOUNTER — Ambulatory Visit (INDEPENDENT_AMBULATORY_CARE_PROVIDER_SITE_OTHER): Payer: No Typology Code available for payment source | Admitting: Family

## 2021-09-16 VITALS — BP 102/65 | HR 80 | Temp 98.1°F | Ht 63.0 in | Wt 123.6 lb

## 2021-09-16 DIAGNOSIS — Z0000 Encounter for general adult medical examination without abnormal findings: Secondary | ICD-10-CM

## 2021-09-16 DIAGNOSIS — I10 Essential (primary) hypertension: Secondary | ICD-10-CM

## 2021-09-16 DIAGNOSIS — Z0001 Encounter for general adult medical examination with abnormal findings: Secondary | ICD-10-CM | POA: Diagnosis not present

## 2021-09-16 DIAGNOSIS — F411 Generalized anxiety disorder: Secondary | ICD-10-CM

## 2021-09-16 DIAGNOSIS — F320 Major depressive disorder, single episode, mild: Secondary | ICD-10-CM | POA: Diagnosis not present

## 2021-09-16 DIAGNOSIS — Z1211 Encounter for screening for malignant neoplasm of colon: Secondary | ICD-10-CM

## 2021-09-16 DIAGNOSIS — R197 Diarrhea, unspecified: Secondary | ICD-10-CM

## 2021-09-16 MED ORDER — BUPROPION HCL ER (XL) 150 MG PO TB24: 150.0000 mg | ORAL_TABLET | Freq: Every day | ORAL | 4 refills | Status: DC

## 2021-09-16 MED ORDER — LISINOPRIL 10 MG PO TABS: 10.0000 mg | ORAL_TABLET | Freq: Every day | ORAL | 4 refills | Status: DC

## 2021-09-16 NOTE — Patient Instructions (Signed)

## 2021-09-16 NOTE — Progress Notes (Signed)
Subjective:    Patient ID: [name], female    DOB: 1966-01-11, 56 y.o.   MRN: [id number]  Chief Complaint  Patient presents with   Annual Exam     PT presents to the office today for CPE without pap and chronic follow up. She has a hx of carotid arthery dissection.  Hypertension This is a chronic problem. The current episode started more than 1 year ago. The problem has been resolved since onset. The problem is uncontrolled. Associated symptoms include anxiety. Pertinent negatives include no malaise/fatigue, peripheral edema or shortness of breath. Risk factors for coronary artery disease include dyslipidemia and sedentary lifestyle. The current treatment provides moderate improvement.  Migraine  This is a chronic problem. The current episode started more than 1 year ago. Episode frequency: months up to 3 months. The pain quality is similar to prior headaches. The treatment provided moderate relief. Her past medical history is significant for hypertension.  Anxiety Presents for follow-up visit. Symptoms include excessive worry, irritability, nervous/anxious behavior and restlessness. Patient reports no shortness of breath. Symptoms occur occasionally. The severity of symptoms is moderate.    Depression        This is a chronic problem.  The current episode started more than 1 year ago.   The problem occurs intermittently.  Associated symptoms include restlessness.  Associated symptoms include no helplessness, no hopelessness and not sad.  Past medical history includes anxiety.      Review of Systems  Constitutional:  Positive for irritability. Negative for malaise/fatigue.  Respiratory:  Negative for shortness of breath.   Psychiatric/Behavioral:  Positive for depression. The patient is nervous/anxious.   All other systems reviewed and are negative.  Family History  Problem Relation Age of Onset   Stroke Paternal Grandfather    Hypertension Father    CAD Father     Stroke Father    Hypertension Mother    Heart murmur Sister    Heart failure Maternal Grandmother    Social History   Socioeconomic History   Marital status: Divorced    Spouse name: Not on file   Number of children: Not on file   Years of education: Not on file   Highest education level: Not on file  Occupational History   Not on file  Tobacco Use   Smoking status: Never   Smokeless tobacco: Never  Vaping Use   Vaping Use: Never used  Substance and Sexual Activity   Alcohol use: No    Alcohol/week: 0.0 standard drinks   Drug use: No   Sexual activity: Not on file  Other Topics Concern   Not on file  Social History Narrative   Not on file   Social Determinants of Health   Financial Resource Strain: Not on file  Food Insecurity: Not on file  Transportation Needs: Not on file  Physical Activity: Not on file  Stress: Not on file  Social Connections: Not on file       Objective:   Physical Exam Vitals reviewed.  Constitutional:      General: She is not in acute distress.    Appearance: She is well-developed.  HENT:     Head: Normocephalic and atraumatic.     Right Ear: Tympanic membrane normal.     Left Ear: Tympanic membrane normal.  Eyes:     Pupils: Pupils are equal, round, and reactive to light.  Neck:     Thyroid: No thyromegaly.  Cardiovascular:     Rate and  Rhythm: Normal rate and regular rhythm.     Heart sounds: Normal heart sounds. No murmur heard. Pulmonary:     Effort: Pulmonary effort is normal. No respiratory distress.     Breath sounds: Normal breath sounds. No wheezing.  Abdominal:     General: Bowel sounds are normal. There is no distension.     Palpations: Abdomen is soft.     Tenderness: There is no abdominal tenderness.  Musculoskeletal:        General: No tenderness. Normal range of motion.     Cervical back: Normal range of motion and neck supple.  Skin:    General: Skin is warm and dry.  Neurological:     Mental Status: She is  alert and oriented to person, place, and time.     Cranial Nerves: No cranial nerve deficit.     Deep Tendon Reflexes: Reflexes are normal and symmetric.  Psychiatric:        Behavior: Behavior normal.        Thought Content: Thought content normal.        Judgment: Judgment normal.      BP 102/65    Pulse 80    Temp 98.1 F (36.7 C) (Temporal)    Ht 5' 3"  (1.6 m)    Wt 123 lb 9.6 oz (56.1 kg)    BMI 21.89 kg/m      Assessment & Plan:  Terrah R Kauffmann comes in today with chief complaint of Annual Exam   Diagnosis and orders addressed:  1. Depression, major, single episode, mild (HCC) - buPROPion (WELLBUTRIN XL) 150 MG 24 hr tablet; Take 1 tablet (150 mg total) by mouth daily. (NEEDS TO BE SEEN BEFORE NEXT REFILL)  Dispense: 90 tablet; Refill: 4 - CMP14+EGFR - CBC with Differential/Platelet  2. GAD (generalized anxiety disorder) - buPROPion (WELLBUTRIN XL) 150 MG 24 hr tablet; Take 1 tablet (150 mg total) by mouth daily. (NEEDS TO BE SEEN BEFORE NEXT REFILL)  Dispense: 90 tablet; Refill: 4 - CMP14+EGFR - CBC with Differential/Platelet  3. Essential hypertension - lisinopril (ZESTRIL) 10 MG tablet; Take 1 tablet (10 mg total) by mouth daily. (Needs to be seen before next refill)  Dispense: 90 tablet; Refill: 4 - CMP14+EGFR - CBC with Differential/Platelet  4. Annual physical exam - Cologuard - CMP14+EGFR - CBC with Differential/Platelet - Lipid panel - TSH  5. Primary hypertension - CMP14+EGFR - CBC with Differential/Platelet  6. Diarrhea, unspecified type - CMP14+EGFR - CBC with Differential/Platelet  7. Colon cancer screening - Cologuard - CMP14+EGFR - CBC with Differential/Platelet   Labs pending Health Maintenance reviewed Diet and exercise encouraged  Follow up plan: 1 year    Evelina Dun, FNP

## 2021-09-17 LAB — CMP14+EGFR
ALT: 13 IU/L (ref 0–32)
AST: 20 IU/L (ref 0–40)
Albumin/Globulin Ratio: 2.1 (ref 1.2–2.2)
Albumin: 4.5 g/dL (ref 3.8–4.9)
Alkaline Phosphatase: 88 IU/L (ref 44–121)
BUN/Creatinine Ratio: 20 (ref 9–23)
BUN: 14 mg/dL (ref 6–24)
Bilirubin Total: 0.7 mg/dL (ref 0.0–1.2)
CO2: 26 mmol/L (ref 20–29)
Calcium: 9.7 mg/dL (ref 8.7–10.2)
Chloride: 101 mmol/L (ref 96–106)
Creatinine, Ser: 0.69 mg/dL (ref 0.57–1.00)
Globulin, Total: 2.1 g/dL (ref 1.5–4.5)
Glucose: 86 mg/dL (ref 70–99)
Potassium: 4.8 mmol/L (ref 3.5–5.2)
Sodium: 136 mmol/L (ref 134–144)
Total Protein: 6.6 g/dL (ref 6.0–8.5)
eGFR: 103 mL/min/{1.73_m2} (ref >59)

## 2021-09-17 LAB — CBC WITH DIFFERENTIAL/PLATELET
Basophils Absolute: 0 10*3/uL (ref 0.0–0.2)
Basos: 1 %
EOS (ABSOLUTE): 0.1 10*3/uL (ref 0.0–0.4)
Eos: 3 %
Hematocrit: 43.2 % (ref 34.0–46.6)
Hemoglobin: 14.8 g/dL (ref 11.1–15.9)
Immature Grans (Abs): 0 10*3/uL (ref 0.0–0.1)
Immature Granulocytes: 0 %
Lymphocytes Absolute: 1.6 10*3/uL (ref 0.7–3.1)
Lymphs: 47 %
MCH: 32.3 pg (ref 26.6–33.0)
MCHC: 34.3 g/dL (ref 31.5–35.7)
MCV: 94 fL (ref 79–97)
Monocytes Absolute: 0.3 10*3/uL (ref 0.1–0.9)
Monocytes: 7 %
Neutrophils Absolute: 1.5 10*3/uL (ref 1.4–7.0)
Neutrophils: 42 %
Platelets: 278 10*3/uL (ref 150–450)
RBC: 4.58 x10E6/uL (ref 3.77–5.28)
RDW: 11.4 % — ABNORMAL LOW (ref 11.7–15.4)
WBC: 3.5 10*3/uL (ref 3.4–10.8)

## 2021-09-17 LAB — LIPID PANEL
Chol/HDL Ratio: 2.8 ratio (ref 0.0–4.4)
Cholesterol, Total: 170 mg/dL (ref 100–199)
HDL: 60 mg/dL (ref >39)
LDL Chol Calc (NIH): 100 mg/dL — ABNORMAL HIGH (ref 0–99)
Triglycerides: 49 mg/dL (ref 0–149)
VLDL Cholesterol Cal: 10 mg/dL (ref 5–40)

## 2021-09-17 LAB — TSH: TSH: 0.874 u[IU]/mL (ref 0.450–4.500)

## 2021-10-10 ENCOUNTER — Ambulatory Visit
Admission: RE | Admit: 2021-10-10 | Discharge: 2021-10-10 | Disposition: A | Discharge status: Home / Self Care | Payer: No Typology Code available for payment source | Source: Ambulatory Visit | Attending: Family | Admitting: Family

## 2021-10-10 DIAGNOSIS — Z1231 Encounter for screening mammogram for malignant neoplasm of breast: Secondary | ICD-10-CM

## 2021-10-10 LAB — COLOGUARD: COLOGUARD: NEGATIVE

## 2022-01-26 ENCOUNTER — Ambulatory Visit: Payer: No Typology Code available for payment source | Admitting: Family Medicine

## 2022-01-26 ENCOUNTER — Encounter: Payer: Self-pay | Admitting: Family Medicine

## 2022-01-26 ENCOUNTER — Ambulatory Visit (INDEPENDENT_AMBULATORY_CARE_PROVIDER_SITE_OTHER): Payer: No Typology Code available for payment source

## 2022-01-26 VITALS — BP 118/73 | HR 88 | Temp 98.6°F | Ht 63.0 in | Wt 126.2 lb

## 2022-01-26 DIAGNOSIS — W5501XA Bitten by cat, initial encounter: Secondary | ICD-10-CM

## 2022-01-26 DIAGNOSIS — L03011 Cellulitis of right finger: Secondary | ICD-10-CM | POA: Diagnosis not present

## 2022-01-26 DIAGNOSIS — S60940A Unspecified superficial injury of right index finger, initial encounter: Secondary | ICD-10-CM

## 2022-01-26 MED ORDER — AMOXICILLIN-POT CLAVULANATE 875-125 MG PO TABS: 1.0000 | ORAL_TABLET | Freq: Two times a day (BID) | ORAL | 0 refills | Status: AC

## 2022-01-26 NOTE — Patient Instructions (Signed)
Animal Bite, Adult Animal bites range from mild to serious. An animal bite can result in any of these injuries: A scratch. A deep, open cut. Broken (punctured) or torn skin. A crush injury. A bone injury. A small bite from a house pet is usually less serious than a bite from a stray or wild animal. Cat bites can be more serious because their long, thin teeth can cause deep puncture wounds that close fast, trapping bacteria inside. Stray or wild animals, such as a raccoon, fox, skunk, or bat, are at higher risk of carrying a serious infection called rabies, which they can pass to a human through a bite. A bite from one of these animals needs medical care right away and, sometimes, rabies vaccination. What increases the risk? You are more likely to be bitten by an animal if: You are around unfamiliar pets. You disturb an animal when it is eating, sleeping, or caring for its babies. You are outdoors in a place where small, wild animals roam freely. What are the signs or symptoms? Common symptoms of an animal bite include: Pain. Bleeding. Swelling. Bruising. How is this diagnosed? This condition may be diagnosed based on a physical exam and medical history. Your health care provider will examine your wound and ask for details about the animal and how the bite happened. You may also have tests, such as: Blood tests to check for infection. X-rays to check for damage to bones or joints. Taking a fluid sample from your wound and checking it for infection (culture test). How is this treated? Treatment depends on the type of animal, where the bite is on your body, and your medical history. Treatment may include: Wound care. This often includes cleaning the wound and rinsing it out (flushing it) with saline solution, which is made of salt and water. A bandage (dressing) is also often applied. In rare cases, the wound may be closed with stitches (sutures), staples, skin glue, or adhesive  strips. Antibiotic medicine to prevent or treat infection. This medicine may be prescribed in pill or ointment form. If the bite area gets infected, the medicine may be given through an IV. A tetanus shot to prevent tetanus infection. Rabies treatment to prevent rabies infection, if the animal could have rabies. Surgery. This may be done if a bite gets infected or causes damage that needs to be repaired. Follow these instructions at home: Medicines Take or apply over-the-counter and prescription medicines only as told by your health care provider. If you were prescribed an antibiotic medicine, take or apply it as told by your health care provider. Do not stop using the antibiotic even if you start to feel better. Wound care  Follow instructions from your health care provider about how to take care of your wound. Make sure you: Wash your hands with soap and water for at least 20 seconds before and after you change your dressing. If soap and water are not available, use hand sanitizer. Change your dressing as told by your health care provider. Leave sutures, skin glue, or adhesive strips in place. These skin closures may need to stay in place for 2 weeks or longer. If adhesive strip edges start to loosen and curl up, you may trim the loose edges. Do not remove adhesive strips completely unless your health care provider tells you to do that. Check your wound every day for signs of infection. Check for: More redness, swelling, or pain. More fluid or blood. Warmth. Pus or a bad smell. General   instructions  Raise (elevate) the injured area above the level of your heart while you are sitting or lying down, if this is possible. If directed, put ice on the injured area. To do this: Put ice in a plastic bag. Place a towel between your skin and the bag. Leave the ice on for 20 minutes, 2-3 times per day. Remove the ice if your skin turns bright red. This is very important. If you cannot feel pain,  heat, or cold, you have a greater risk of damage to the area. Keep all follow-up visits. This is important. Contact a health care provider if: You have more redness, swelling, or pain around your wound. Your wound feels warm to the touch. You have a fever or chills. You have a general feeling of sickness (malaise). You feel nauseous or you vomit. You have pain that does not get better. Get help right away if: You have a red streak that leads away from your wound. You have non-clear fluid or more blood coming from your wound. There is pus or a bad smell coming from your wound. You have trouble moving your injured area. You have numbness or tingling that spreads beyond your wound. Summary Animal bites can range from mild to serious. An animal bite can cause a scratch on the skin, a deep and open cut, torn or punctured skin, a crush injury, or a bone injury. A bite from a stray or wild animal needs medical care right away and, sometimes, rabies vaccination. Your health care provider will examine your wound and ask for details about the animal and how the bite happened. Treatment may include wound care, antibiotic medicine, a tetanus shot, and rabies treatment if the animal could have rabies. This information is not intended to replace advice given to you by your health care provider. Make sure you discuss any questions you have with your health care provider. Document Revised: 09/02/2021 Document Reviewed: 09/02/2021 Elsevier Patient Education  2023 [employer]  

## 2022-01-26 NOTE — Progress Notes (Signed)
   Acute Office Visit  Subjective:     Patient ID: [name], female    DOB: 01-09-1966, 56 y.o.   MRN: [id number]  Chief Complaint  Patient presents with   Animal Bite    HPI Patient is in today for a cat bite to her right pointer finger. This occurred 6 days ago. The bite occurred from her own cat. The cat has been vaccinated against rabies, but not UTD for this. She reports that the cat has never been outside. She reports pain, tenderness, and swelling in this finger. This has been worsening. She is having some intermittent tingling in her finger. There is a small puncture on the top and bottom of the finger. Denies drainage, fever, chills, numbness, decrease ROM, or neurological symptoms. She had some leftover Keflex. She started taking this TID and has had 11 doses of this. She has also been using alcohol, peroxide, and antibiotic ointment. Tetanus vaccine is UTD within the last 5 years.    ROS As per HPI.      Objective:    BP 118/73   Pulse 88   Temp 98.6 F (37 C) (Temporal)   Ht 5\' 3"  (1.6 m)   Wt 126 lb 4 oz (57.3 kg)   SpO2 99%   BMI 22.36 kg/m    Physical Exam Vitals and nursing note reviewed.  Constitutional:      General: She is not in acute distress.    Appearance: She is not ill-appearing, toxic-appearing or diaphoretic.  Pulmonary:     Effort: Pulmonary effort is normal. No respiratory distress.  Musculoskeletal:     Comments: Right hand pointer finger: diffuse swelling to finger with tenderness and warmth. Mild erythema. ROM intact. Brisk capillary refill, sensation intact. Small puncture wounds to tip of finger on dorsal and palmer side. No drainage or foreign objects noted.   Skin:    General: Skin is warm and dry.  Neurological:     General: No focal deficit present.     Mental Status: She is alert and oriented to person, place, and time.     Motor: No weakness.     Gait: Gait normal.  Psychiatric:        Mood and Affect: Mood normal.         Behavior: Behavior normal.    No results found for any visits on 01/26/22.      Assessment & Plan:   [name] was seen today for animal bite.  Diagnoses and all orders for this visit:  Cat bite, initial encounter Cellulitis of finger of right hand No signs of osteomyelitis on xray today. Augmentin as below. Stat referral placed to hand surgery. Tetanus is UTD. Strict return precautions given.  -     DG Finger Index Right; Future -     amoxicillin-clavulanate (AUGMENTIN) 875-125 MG tablet; Take 1 tablet by mouth 2 (two) times daily for 10 days. -     Ambulatory referral to Hand Surgery  Return if symptoms worsen or fail to improve.  The patient indicates understanding of these issues and agrees with the plan.  Caren Griffins, FNP

## 2022-01-30 ENCOUNTER — Encounter: Payer: Self-pay | Admitting: Orthopedic Surgery

## 2022-01-30 ENCOUNTER — Ambulatory Visit: Payer: No Typology Code available for payment source | Admitting: Orthopedic Surgery

## 2022-01-30 DIAGNOSIS — S61250A Open bite of right index finger without damage to nail, initial encounter: Secondary | ICD-10-CM | POA: Diagnosis not present

## 2022-01-30 DIAGNOSIS — S61259A Open bite of unspecified finger without damage to nail, initial encounter: Secondary | ICD-10-CM | POA: Diagnosis not present

## 2022-01-30 DIAGNOSIS — W5501XA Bitten by cat, initial encounter: Secondary | ICD-10-CM | POA: Diagnosis not present

## 2022-01-30 NOTE — Progress Notes (Signed)
Office Visit Note   Patient: [name]           Date of Birth: [birth year]           MRN: [id number] Visit Date: 01/30/2022              Requested by: Gabriel Earing, FNP [address],  [place] [address] PCP: Junie Spencer, FNP   Assessment & Plan: Visit Diagnoses:  1. Cat bite of finger, initial encounter     Plan: Discussed with patient that she does not have any evidence of flexor tenosynovitis.  There is no discrete fluid collection consistent with an abscess.  Her pain seems to be much improved over the last day or two.  She notes that her finger tip pulp was very tense and tight previously but this has resolved.  She does not have any indication for surgical debridement at this point.  She will complete her course of antibiotics which she started on the 18th.  I can see her back after the completion of antibiotics if she is still symptomatic.  Follow-Up Instructions: No follow-ups on file.   Orders:  No orders of the defined types were placed in this encounter.  No orders of the defined types were placed in this encounter.     Procedures: No procedures performed   Clinical Data: No additional findings.   Subjective: Chief Complaint  Patient presents with   Right Index Finger - Injury    Cat Bite on 01/20/22, Pain 6-7/10 now was off the charts, RIGHT handed, Taking Augmentin, throbs really, takes Aleve for pain, swollen and red, says right arm seems weaker, and this does keep her up at night    This is a 56 year old right-hand-dominant female presents following a cat bite to the right index finger.  The injury occurred on the 12th when she was bitten on the tip of the index finger distal to the DIP flexion crease.  She started Keflex which she had laying around the house.  She was then seen by her primary care provider who started her on Augmentin on 5/18.  She was given a 10-day course.  Previously her finger was extremely painful and throbbing.   This kept her up at night.  That pain has improved and she was able to sleep through the night last night with any pain medication.  She notes that previously the very tip of her finger was very tense and "tight."  This has improved.  There was some concern by her primary care provider that the erythema was extending to the level of the palmar digital crease or into the palm.  This has resolved.  Her swelling has persisted.  She had to fully extend her finger and is able to make somewhat of a fist but has limited range of motion of the index finger secondary to stiffness and swelling.  She denies any systemic symptoms.  Injury   Review of Systems   Objective: Vital Signs: BP 138/78 (BP Location: Left Arm, Patient Position: Sitting)   Pulse 80   Ht 5\' 3"  (1.6 m)   Wt 126 lb (57.2 kg)   BMI 22.32 kg/m   Physical Exam Constitutional:      Appearance: Normal appearance.  Cardiovascular:     Rate and Rhythm: Normal rate.     Pulses: Normal pulses.  Pulmonary:     Effort: Pulmonary effort is normal.  Skin:    General: Skin is warm and  dry.     Capillary Refill: Capillary refill takes less than 2 seconds.  Neurological:     Mental Status: She is alert.    Right Hand Exam   Tenderness  Right hand tenderness location: TTP directly over the bite at the volar aspect of the finger distal to the DIP flexion crease.  Minimal TTP over proximal phalanx.  Other  Sensation: normal Pulse: present  Comments:  Mild erythema centered over the bite mark distal to the DIP flexion crease and extending to the level of the middle phalanx.  Able to fully extend finger with no discomfort.  Limited flexion secondary to swelling and stiffness rather than pain.  Diffuse swelling of finger without discrete fluid collection.      Specialty Comments:  No specialty comments available.  Imaging: No results found.   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   Past Medical History:  Diagnosis Date   Carotid artery dissection (HCC)    Cervical radiculopathy    Low back pain    Lumbar radiculopathy    Migraine headache    Neck pain     Family History  Problem Relation Age of Onset   Stroke Paternal Grandfather    Hypertension Father    CAD Father    Stroke Father    Hypertension Mother    Heart murmur Sister    Heart failure Maternal Grandmother     Past Surgical History:  Procedure Laterality Date   BACK SURGERY     CERVICAL FUSION     Social History   Occupational History   Not on file  Tobacco Use   Smoking status: Never   Smokeless tobacco: Never  Vaping Use   Vaping Use: Never used  Substance and Sexual Activity   Alcohol use: No    Alcohol/week: 0.0 standard drinks   Drug use: No   Sexual activity: Not on file

## 2022-02-03 ENCOUNTER — Ambulatory Visit: Payer: No Typology Code available for payment source | Admitting: Orthopedic Surgery

## 2022-05-11 ENCOUNTER — Encounter: Payer: Self-pay | Admitting: Family Medicine

## 2022-05-11 ENCOUNTER — Ambulatory Visit: Payer: No Typology Code available for payment source | Admitting: Family Medicine

## 2022-05-11 VITALS — BP 132/85 | HR 87 | Temp 98.2°F | Ht 63.0 in | Wt 125.6 lb

## 2022-05-11 DIAGNOSIS — M461 Sacroiliitis, not elsewhere classified: Secondary | ICD-10-CM | POA: Diagnosis not present

## 2022-05-11 MED ORDER — PREDNISONE 10 MG PO TABS: ORAL_TABLET | ORAL | 0 refills | Status: DC

## 2022-05-11 NOTE — Progress Notes (Signed)
Subjective:  Patient ID: [name], female    DOB: 1966/01/13  Age: 56 y.o. MRN: [id number]  CC: Back Pain (Left lower)   HPI [name] presents for L4, L5 ruptured discs in the past.  Now 2 weeks of back pain. Went bowling at onset. Taking meloxicam with partial relief. (Prescription is 56 years old.) Decreased mobility but still ambulatory. Pain is moderately severe. Points to left L5 - S1 at the SI superiorly     05/11/2022    3:06 PM 01/26/2022   10:46 AM 09/16/2021    9:22 AM  Depression screen PHQ 2/9  Decreased Interest 0 0 0  Down, Depressed, Hopeless 0 0 0  PHQ - 2 Score 0 0 0  Altered sleeping  0 0  Tired, decreased energy  0 0  Change in appetite  0 0  Feeling bad or failure about yourself   0 0  Trouble concentrating  0 0  Moving slowly or fidgety/restless  0 0  Suicidal thoughts  0 0  PHQ-9 Score  0 0  Difficult doing work/chores  Not difficult at all Not difficult at all    History [name] has a past medical history of Carotid artery dissection (HCC), Cervical radiculopathy, Low back pain, Lumbar radiculopathy, Migraine headache, and Neck pain.   She has a past surgical history that includes Back surgery and Cervical fusion.   Her family history includes CAD in her father; Heart failure in her maternal grandmother; Heart murmur in her sister; Hypertension in her father and mother; Stroke in her father and paternal grandfather.She reports that she has never smoked. She has never used smokeless tobacco. She reports that she does not drink alcohol and does not use drugs.    ROS Review of Systems  Objective:  BP 132/85   Pulse 87   Temp 98.2 F (36.8 C)   Ht 5\' 3"  (1.6 m)   Wt 125 lb 9.6 oz (57 kg)   SpO2 97%   BMI 22.25 kg/m   BP Readings from Last 3 Encounters:  05/11/22 132/85  01/30/22 138/78  01/26/22 118/73    Wt Readings from Last 3 Encounters:  05/11/22 125 lb 9.6 oz (57 kg)  01/30/22 126 lb (57.2 kg)  01/26/22 126 lb 4 oz  (57.3 kg)     Physical Exam Musculoskeletal:        General: Tenderness (left L5- SI and superior SI area. No masses. SLR negative bilaterally. 01/28/22 is equivocal) present.       Assessment & Plan:   [name] was seen today for back pain.  Diagnoses and all orders for this visit:  Sacroiliitis (HCC)  Other orders -     predniSONE (DELTASONE) 10 MG tablet; Take 5 daily for 3 days followed by 4,3,2 and 1 for 3 days each.    Back exercise program brochure given   I am having [name] start on predniSONE. I am also having her maintain her Multiple Vitamins-Minerals (MULTI ADULT GUMMIES PO), aspirin, vitamin C, calcium citrate-vitamin D, fluticasone, fexofenadine, SUMAtriptan, buPROPion, and lisinopril.  Allergies as of 05/11/2022   No Known Allergies      Medication List        Accurate as of May 11, 2022  4:24 PM. If you have any questions, ask your nurse or doctor.          aspirin 81 MG chewable tablet Chew by mouth daily.   buPROPion 150 MG 24 hr tablet Commonly known  as: WELLBUTRIN XL Take 1 tablet (150 mg total) by mouth daily. (NEEDS TO BE SEEN BEFORE NEXT REFILL)   calcium citrate-vitamin D 315-200 MG-UNIT tablet Commonly known as: CITRACAL+D Take 1 tablet by mouth 2 (two) times daily.   fexofenadine 60 MG tablet Commonly known as: ALLEGRA Take 60 mg by mouth 2 (two) times daily.   fluticasone 50 MCG/ACT nasal spray Commonly known as: FLONASE Place into both nostrils daily.   lisinopril 10 MG tablet Commonly known as: ZESTRIL Take 1 tablet (10 mg total) by mouth daily. (Needs to be seen before next refill)   MULTI ADULT GUMMIES PO Take by mouth daily.   predniSONE 10 MG tablet Commonly known as: DELTASONE Take 5 daily for 3 days followed by 4,3,2 and 1 for 3 days each. Started by: Mechele Claude, MD   SUMAtriptan 50 MG tablet Commonly known as: IMITREX TAKE 1 TABLET AS NEEDED AS DIRECTED   vitamin C 100 MG tablet Take  100 mg by mouth daily.         Follow-up: Return in about 2 weeks (around 05/25/2022) for With Ms. Hawks for back pain.  Mechele Claude, M.D.

## 2022-05-11 NOTE — Patient Instructions (Signed)

## 2022-09-07 ENCOUNTER — Ambulatory Visit: Payer: No Typology Code available for payment source | Admitting: Family Medicine

## 2022-09-07 ENCOUNTER — Encounter: Payer: Self-pay | Admitting: Family Medicine

## 2022-09-07 VITALS — BP 123/76 | HR 89 | Temp 98.6°F | Ht 63.0 in | Wt 130.2 lb

## 2022-09-07 DIAGNOSIS — H6593 Unspecified nonsuppurative otitis media, bilateral: Secondary | ICD-10-CM | POA: Diagnosis not present

## 2022-09-07 DIAGNOSIS — J0110 Acute frontal sinusitis, unspecified: Secondary | ICD-10-CM | POA: Diagnosis not present

## 2022-09-07 MED ORDER — AMOXICILLIN-POT CLAVULANATE 875-125 MG PO TABS: 1.0000 | ORAL_TABLET | Freq: Two times a day (BID) | ORAL | 0 refills | Status: AC

## 2022-09-07 NOTE — Progress Notes (Signed)
Acute Office Visit  Subjective:     Patient ID: [name], female    DOB: 26-Jan-1966, 56 y.o.   MRN: [id number]  Chief Complaint  Patient presents with   Ear Pain    Pt states she has been having ringing in both ears - states it has been popping feeling like she is under water - every time it happens she gets a headache    Headache    These has been going on for about 30 days     Headache    Patient is in today for ringing in her ears for the last few weeks. She also reports congestion in her ears. She has had some intermittent ear pain. No fever or drainage. She also has been having left sided headaches over her left frontal sinus for the last month. She has had intermittent nasal congestion as well. She uses flonase daily. She has been using allegra D intermittently.   Review of Systems  Neurological:  Positive for headaches.   As per HPI.      Objective:    BP 123/76   Pulse 89   Temp 98.6 F (37 C)   Ht 5\' 3"  (1.6 m)   Wt 130 lb 3.2 oz (59.1 kg)   SpO2 96%   BMI 23.06 kg/m    Physical Exam Vitals and nursing note reviewed.  Constitutional:      General: She is not in acute distress.    Appearance: Normal appearance. She is not ill-appearing, toxic-appearing or diaphoretic.  HENT:     Head: Normocephalic and atraumatic.     Right Ear: Ear canal and external ear normal. A middle ear effusion is present. Tympanic membrane is not scarred, perforated, erythematous, retracted or bulging.     Left Ear: Ear canal and external ear normal. A middle ear effusion is present. Tympanic membrane is not scarred, perforated, erythematous, retracted or bulging.     Ears:     Comments: TMs dull bilaterally    Nose:     Right Sinus: No maxillary sinus tenderness or frontal sinus tenderness.     Left Sinus: Frontal sinus tenderness present. No maxillary sinus tenderness.     Mouth/Throat:     Mouth: Mucous membranes are moist.     Pharynx: Oropharynx is clear. No  oropharyngeal exudate or posterior oropharyngeal erythema.  Eyes:     General:        Right eye: No discharge.        Left eye: No discharge.     Extraocular Movements: Extraocular movements intact.     Pupils: Pupils are equal, round, and reactive to light.  Cardiovascular:     Rate and Rhythm: Normal rate and regular rhythm.     Heart sounds: Normal heart sounds. No murmur heard. Pulmonary:     Effort: Pulmonary effort is normal.     Breath sounds: Normal breath sounds.  Musculoskeletal:     Cervical back: No rigidity.  Skin:    General: Skin is warm and dry.  Neurological:     General: No focal deficit present.     Mental Status: She is alert and oriented to person, place, and time.  Psychiatric:        Mood and Affect: Mood normal.        Behavior: Behavior normal.     No results found for any visits on 09/07/22.      Assessment & Plan:   Verneal was seen today for  ear pain and headache.  Diagnoses and all orders for this visit:  Acute non-recurrent frontal sinusitis Middle ear effusion, bilateral Augmentin as below. Continue flonase. Allegra D daily for the next couple weeks. Return to office for new or worsening symptoms, or if symptoms persist.   -     amoxicillin-clavulanate (AUGMENTIN) 875-125 MG tablet; Take 1 tablet by mouth 2 (two) times daily for 7 days.   Return in about 7 weeks (around 10/25/2022) for CPE.  The patient indicates understanding of these issues and agrees with the plan.   Gabriel Earing, FNP

## 2022-09-22 ENCOUNTER — Other Ambulatory Visit: Payer: Self-pay | Admitting: Family

## 2022-09-22 DIAGNOSIS — F320 Major depressive disorder, single episode, mild: Secondary | ICD-10-CM

## 2022-09-22 DIAGNOSIS — F411 Generalized anxiety disorder: Secondary | ICD-10-CM

## 2022-10-03 ENCOUNTER — Other Ambulatory Visit: Payer: Self-pay | Admitting: Family

## 2022-10-03 DIAGNOSIS — Z1231 Encounter for screening mammogram for malignant neoplasm of breast: Secondary | ICD-10-CM

## 2022-10-16 ENCOUNTER — Ambulatory Visit
Admission: RE | Admit: 2022-10-16 | Discharge: 2022-10-16 | Disposition: A | Discharge status: Home / Self Care | Payer: No Typology Code available for payment source | Source: Ambulatory Visit | Attending: Family | Admitting: Family

## 2022-10-16 DIAGNOSIS — Z1231 Encounter for screening mammogram for malignant neoplasm of breast: Secondary | ICD-10-CM

## 2022-10-26 ENCOUNTER — Encounter: Payer: Self-pay | Admitting: Family

## 2022-10-26 ENCOUNTER — Ambulatory Visit (INDEPENDENT_AMBULATORY_CARE_PROVIDER_SITE_OTHER): Payer: No Typology Code available for payment source | Admitting: Family

## 2022-10-26 VITALS — BP 121/75 | HR 84 | Temp 97.0°F | Resp 20 | Ht 63.0 in | Wt 126.0 lb

## 2022-10-26 DIAGNOSIS — Z0001 Encounter for general adult medical examination with abnormal findings: Secondary | ICD-10-CM

## 2022-10-26 DIAGNOSIS — F320 Major depressive disorder, single episode, mild: Secondary | ICD-10-CM | POA: Diagnosis not present

## 2022-10-26 DIAGNOSIS — F411 Generalized anxiety disorder: Secondary | ICD-10-CM

## 2022-10-26 DIAGNOSIS — Z8679 Personal history of other diseases of the circulatory system: Secondary | ICD-10-CM

## 2022-10-26 DIAGNOSIS — Z0000 Encounter for general adult medical examination without abnormal findings: Secondary | ICD-10-CM

## 2022-10-26 DIAGNOSIS — I10 Essential (primary) hypertension: Secondary | ICD-10-CM

## 2022-10-26 MED ORDER — LISINOPRIL 10 MG PO TABS: 10.0000 mg | ORAL_TABLET | Freq: Every day | ORAL | 4 refills | Status: DC

## 2022-10-26 MED ORDER — BUPROPION HCL ER (XL) 150 MG PO TB24: 150.0000 mg | ORAL_TABLET | Freq: Every day | ORAL | 3 refills | Status: DC

## 2022-10-26 NOTE — Patient Instructions (Signed)
Tinnitus ?Tinnitus refers to hearing a sound when there is no actual source for that sound. This is often described as ringing in the ears. However, people with this condition may hear a variety of noises, in one ear or in both ears. ?The sounds of tinnitus can be soft, loud, or somewhere in between. Tinnitus can last for a few seconds or can be constant for days. It may go away without treatment and come back at various times. When tinnitus is constant or happens often, it can lead to other problems, such as trouble sleeping and trouble concentrating. ?Almost everyone experiences tinnitus at some point. Tinnitus is not the same as hearing loss. Tinnitus that is long-lasting (chronic) or comes back often (recurs) may require medical attention. ?What are the causes? ?The cause of tinnitus is often not known. In some cases, it can result from: ?Exposure to loud noises from machinery, music, or other sources. ?An object (foreign body) stuck in the ear. ?Earwax buildup. ?Drinking alcohol or caffeine. ?Taking certain medicines. ?Age-related hearing loss. ?It may also be caused by medical conditions such as: ?Ear or sinus infections. ?Heart diseases or high blood pressure. ?Allergies. ?M?ni?re's disease. ?Thyroid problems. ?Tumors. ?A weak, bulging blood vessel (aneurysm) near the ear. ?What increases the risk? ?The following factors may make you more likely to develop this condition: ?Exposure to loud noises. ?Age. Tinnitus is more likely in older individuals. ?Using alcohol or tobacco. ?What are the signs or symptoms? ?The main symptom of tinnitus is hearing a sound when there is no source for that sound. It may sound like: ?Buzzing. ?Sizzling. ?Ringing. ?Blowing air. ?Hissing. ?Whistling. ?Other sounds may include: ?Roaring. ?Running water. ?A musical note. ?Tapping. ?Humming. ?Symptoms may affect only one ear (unilateral) or both ears (bilateral). ?How is this diagnosed? ?Tinnitus is diagnosed based on your symptoms,  your medical history, and a physical exam. Your health care provider may do a thorough hearing test (audiologic exam) if your tinnitus: ?Is unilateral. ?Causes hearing difficulties. ?Lasts 6 months or longer. ?You may work with a health care provider who specializes in hearing disorders (audiologist). You may be asked questions about your symptoms and how they affect your daily life. You may have other tests done, such as: ?CT scan. ?MRI. ?An imaging test of how blood flows through your blood vessels (angiogram). ?How is this treated? ?Treating an underlying medical condition can sometimes make tinnitus go away. If your tinnitus continues, other treatments may include: ?Therapy and counseling to help you manage the stress of living with tinnitus. ?Sound generators to mask the tinnitus. These include: ?Tabletop sound machines that play relaxing sounds to help you fall asleep. ?Wearable devices that fit in your ear and play sounds or music. ?Acoustic neural stimulation. This involves using headphones to listen to music that contains an auditory signal. Over time, listening to this signal may change some pathways in your brain and make you less sensitive to tinnitus. This treatment is used for very severe cases when no other treatment is working. ?Using hearing aids or cochlear implants if your tinnitus is related to hearing loss. Hearing aids are worn in the outer ear. Cochlear implants are surgically placed in the inner ear. ?Follow these instructions at home: ?Managing symptoms ? ?  ? ?When possible, avoid being in loud places and being exposed to loud sounds. ?Wear hearing protection, such as earplugs, when you are exposed to loud noises. ?Use a white noise machine, a humidifier, or other devices to mask the sound of tinnitus. ?  Practice techniques for reducing stress, such as meditation, yoga, or deep breathing. Work with your health care provider if you need help with managing stress. ?Sleep with your head  slightly raised. This may reduce the impact of tinnitus. ?General instructions ?Do not use stimulants, such as nicotine, alcohol, or caffeine. Talk with your health care provider about other stimulants to avoid. Stimulants are substances that can make you feel alert and attentive by increasing certain activities in the body (such as heart rate and blood pressure). These substances may make tinnitus worse. ?Take over-the-counter and prescription medicines only as told by your health care provider. ?Try to get plenty of sleep each night. ?Keep all follow-up visits. This is important. ?Contact a health care provider if: ?Your tinnitus continues for 3 weeks or longer without stopping. ?You develop sudden hearing loss. ?Your symptoms get worse or do not get better with home care. ?You feel you are not able to manage the stress of living with tinnitus. ?Get help right away if: ?You develop tinnitus after a head injury. ?You have tinnitus along with any of the following: ?Dizziness. ?Nausea and vomiting. ?Loss of balance. ?Sudden, severe headache. ?Vision changes. ?Facial weakness or weakness of arms or legs. ?These symptoms may represent a serious problem that is an emergency. Do not wait to see if the symptoms will go away. Get medical help right away. Call your local emergency services (911 in the [place] Do not drive yourself to the hospital. ?Summary ?Tinnitus refers to hearing a sound when there is no actual source for that sound. This is often described as ringing in the ears. ?Symptoms may affect only one ear (unilateral) or both ears (bilateral). ?Use a white noise machine, a humidifier, or other devices to mask the sound of tinnitus. ?Do not use stimulants, such as nicotine, alcohol, or caffeine. These substances may make tinnitus worse. ?This information is not intended to replace advice given to you by your health care provider. Make sure you discuss any questions you have with your health care  provider. ?Document Revised: 08/02/2020 Document Reviewed: 08/02/2020 ?Elsevier Patient Education ? 2023 [employer] ? ?

## 2022-10-26 NOTE — Progress Notes (Signed)
Subjective:    Patient ID: [name], female    DOB: 12/08/1965, 57 y.o.   MRN: [id number]  Chief Complaint  Patient presents with   Annual Exam   PT presents to the office today for CPE without pap and chronic follow up. She has a hx of carotid arthery dissection.   Hypertension This is a chronic problem. The current episode started more than 1 year ago. The problem has been resolved since onset. Associated symptoms include anxiety. Pertinent negatives include no malaise/fatigue, peripheral edema or shortness of breath. Risk factors for coronary artery disease include sedentary lifestyle. The current treatment provides moderate improvement. There is no history of heart failure.  Anxiety Presents for follow-up visit. Symptoms include depressed mood, excessive worry, irritability and nervous/anxious behavior. Patient reports no hyperventilation or shortness of breath. Symptoms occur occasionally. The severity of symptoms is mild.    Depression        This is a chronic problem.  The current episode started more than 1 year ago.   Associated symptoms include no helplessness, no hopelessness and not sad.  Past medical history includes anxiety.        Review of Systems  Constitutional:  Positive for irritability. Negative for malaise/fatigue.  Respiratory:  Negative for shortness of breath.   Psychiatric/Behavioral:  Positive for depression. The patient is nervous/anxious.   All other systems reviewed and are negative.  Family History  Problem Relation Age of Onset   Hypertension Mother    Hypertension Father    CAD Father    Stroke Father    Heart murmur Sister    Heart failure Maternal Grandmother    Stroke Paternal Grandfather    Breast cancer Neg Hx    Social History   Socioeconomic History   Marital status: Divorced    Spouse name: Not on file   Number of children: Not on file   Years of education: Not on file   Highest education level: Not on file   Occupational History   Not on file  Tobacco Use   Smoking status: Never   Smokeless tobacco: Never  Vaping Use   Vaping Use: Never used  Substance and Sexual Activity   Alcohol use: No    Alcohol/week: 0.0 standard drinks of alcohol   Drug use: No   Sexual activity: Not on file  Other Topics Concern   Not on file  Social History Narrative   Not on file   Social Determinants of Health   Financial Resource Strain: Not on file  Food Insecurity: Not on file  Transportation Needs: Not on file  Physical Activity: Not on file  Stress: Not on file  Social Connections: Not on file       Objective:   Physical Exam Vitals reviewed.  Constitutional:      General: She is not in acute distress.    Appearance: She is well-developed.  HENT:     Head: Normocephalic and atraumatic.     Right Ear: Tympanic membrane normal.     Left Ear: Tympanic membrane normal.  Eyes:     Pupils: Pupils are equal, round, and reactive to light.  Neck:     Thyroid: No thyromegaly.  Cardiovascular:     Rate and Rhythm: Normal rate and regular rhythm.     Heart sounds: Normal heart sounds. No murmur heard. Pulmonary:     Effort: Pulmonary effort is normal. No respiratory distress.     Breath sounds: Normal breath sounds. No  wheezing.  Abdominal:     General: Bowel sounds are normal. There is no distension.     Palpations: Abdomen is soft.     Tenderness: There is no abdominal tenderness.  Musculoskeletal:        General: No tenderness. Normal range of motion.     Cervical back: Normal range of motion and neck supple.  Skin:    General: Skin is warm and dry.  Neurological:     Mental Status: She is alert and oriented to person, place, and time.     Cranial Nerves: No cranial nerve deficit.     Deep Tendon Reflexes: Reflexes are normal and symmetric.  Psychiatric:        Behavior: Behavior normal.        Thought Content: Thought content normal.        Judgment: Judgment normal.        BP 121/75   Pulse 84   Temp (!) 97 F (36.1 C) (Oral)   Resp 20   Ht 5' 3"$  (1.6 m)   Wt 126 lb (57.2 kg)   SpO2 96%   BMI 22.32 kg/m      Assessment & Plan:  [name] comes in today with chief complaint of Annual Exam   Diagnosis and orders addressed:  1. GAD (generalized anxiety disorder) - buPROPion (WELLBUTRIN XL) 150 MG 24 hr tablet; Take 1 tablet (150 mg total) by mouth daily.  Dispense: 90 tablet; Refill: 3 - CMP14+EGFR - CBC with Differential/Platelet  2. Depression, major, single episode, mild (HCC) - buPROPion (WELLBUTRIN XL) 150 MG 24 hr tablet; Take 1 tablet (150 mg total) by mouth daily.  Dispense: 90 tablet; Refill: 3 - CMP14+EGFR - CBC with Differential/Platelet  3. Essential hypertension - lisinopril (ZESTRIL) 10 MG tablet; Take 1 tablet (10 mg total) by mouth daily.  Dispense: 90 tablet; Refill: 4 - CMP14+EGFR - CBC with Differential/Platelet  4. History of carotid artery dissection - CMP14+EGFR - CBC with Differential/Platelet  5. Primary hypertension - CMP14+EGFR - CBC with Differential/Platelet  6. Annual physical exam - CMP14+EGFR - CBC with Differential/Platelet - Lipid panel - TSH - VITAMIN D 25 Hydroxy (Vit-D Deficiency, Fractures) - Lipoprotein A (LPA)   Labs pending Health Maintenance reviewed Diet and exercise encouraged  Follow up plan: 1 year    Evelina Dun, FNP

## 2022-10-27 LAB — LIPID PANEL

## 2022-10-31 LAB — CBC WITH DIFFERENTIAL/PLATELET
Basophils Absolute: 0 10*3/uL (ref 0.0–0.2)
Basos: 1 %
EOS (ABSOLUTE): 0.1 10*3/uL (ref 0.0–0.4)
Eos: 2 %
Hematocrit: 44.8 % (ref 34.0–46.6)
Hemoglobin: 14.6 g/dL (ref 11.1–15.9)
Immature Grans (Abs): 0 10*3/uL (ref 0.0–0.1)
Immature Granulocytes: 0 %
Lymphocytes Absolute: 1.6 10*3/uL (ref 0.7–3.1)
Lymphs: 34 %
MCH: 31.3 pg (ref 26.6–33.0)
MCHC: 32.6 g/dL (ref 31.5–35.7)
MCV: 96 fL (ref 79–97)
Monocytes Absolute: 0.3 10*3/uL (ref 0.1–0.9)
Monocytes: 6 %
Neutrophils Absolute: 2.8 10*3/uL (ref 1.4–7.0)
Neutrophils: 57 %
Platelets: 283 10*3/uL (ref 150–450)
RBC: 4.67 x10E6/uL (ref 3.77–5.28)
RDW: 11.9 % (ref 11.7–15.4)
WBC: 4.8 10*3/uL (ref 3.4–10.8)

## 2022-10-31 LAB — TSH: TSH: 0.675 u[IU]/mL (ref 0.450–4.500)

## 2022-10-31 LAB — LIPID PANEL
Chol/HDL Ratio: 3.2 ratio (ref 0.0–4.4)
Cholesterol, Total: 175 mg/dL (ref 100–199)
HDL: 55 mg/dL (ref >39)
LDL Chol Calc (NIH): 105 mg/dL — ABNORMAL HIGH (ref 0–99)
Triglycerides: 83 mg/dL (ref 0–149)
VLDL Cholesterol Cal: 15 mg/dL (ref 5–40)

## 2022-10-31 LAB — CMP14+EGFR
ALT: 11 IU/L (ref 0–32)
AST: 16 IU/L (ref 0–40)
Albumin/Globulin Ratio: 2.1 (ref 1.2–2.2)
Albumin: 4.7 g/dL (ref 3.8–4.9)
Alkaline Phosphatase: 101 IU/L (ref 44–121)
BUN/Creatinine Ratio: 21 (ref 9–23)
BUN: 18 mg/dL (ref 6–24)
Bilirubin Total: 0.6 mg/dL (ref 0.0–1.2)
CO2: 20 mmol/L (ref 20–29)
Calcium: 9.7 mg/dL (ref 8.7–10.2)
Chloride: 105 mmol/L (ref 96–106)
Creatinine, Ser: 0.86 mg/dL (ref 0.57–1.00)
Globulin, Total: 2.2 g/dL (ref 1.5–4.5)
Glucose: 89 mg/dL (ref 70–99)
Potassium: 4.6 mmol/L (ref 3.5–5.2)
Sodium: 141 mmol/L (ref 134–144)
Total Protein: 6.9 g/dL (ref 6.0–8.5)
eGFR: 79 mL/min/{1.73_m2} (ref >59)

## 2022-10-31 LAB — VITAMIN D 25 HYDROXY (VIT D DEFICIENCY, FRACTURES): Vit D, 25-Hydroxy: 39.3 ng/mL (ref 30.0–100.0)

## 2022-10-31 LAB — LIPOPROTEIN A (LPA): Lipoprotein (a): 20.4 nmol/L (ref <75.0)

## 2022-11-07 ENCOUNTER — Telehealth: Payer: Self-pay | Admitting: Family

## 2022-11-07 NOTE — Telephone Encounter (Signed)
Needs appt for back pain

## 2022-11-09 ENCOUNTER — Encounter: Payer: Self-pay | Admitting: Nurse Practitioner

## 2022-11-09 ENCOUNTER — Ambulatory Visit: Payer: No Typology Code available for payment source | Admitting: Nurse Practitioner

## 2022-11-09 VITALS — BP 130/78 | HR 102 | Temp 98.3°F | Resp 20 | Ht 63.0 in | Wt 125.0 lb

## 2022-11-09 DIAGNOSIS — M5432 Sciatica, left side: Secondary | ICD-10-CM | POA: Diagnosis not present

## 2022-11-09 MED ORDER — METHYLPREDNISOLONE ACETATE 80 MG/ML IJ SUSP
80.0000 mg | Freq: Once | INTRAMUSCULAR | Status: AC
  Administered 2022-11-09: 80 mg via INTRAMUSCULAR

## 2022-11-09 MED ORDER — PREDNISONE 20 MG PO TABS: ORAL_TABLET | ORAL | 0 refills | Status: DC

## 2022-11-09 MED ORDER — KETOROLAC TROMETHAMINE 60 MG/2ML IM SOLN
60.0000 mg | Freq: Once | INTRAMUSCULAR | Status: AC
  Administered 2022-11-09: 60 mg via INTRAMUSCULAR

## 2022-11-09 NOTE — Patient Instructions (Signed)
Sciatica  Sciatica is pain, weakness, tingling, or loss of feeling (numbness) along the sciatic nerve. The sciatic nerve starts in the lower back and goes down the back of each leg. Sciatica usually affects one side of the body. Sciatica usually goes away on its own or with treatment. Sometimes, sciatica may come back. What are the causes? This condition happens when the sciatic nerve is pinched or has pressure put on it. This may be caused by: A disk in between the bones of the spine bulging out too far (herniated disk). Changes in the spinal disks due to aging. A condition that affects a muscle in the butt. Extra bone growth near the sciatic nerve. A break (fracture) of the area between your hip bones (pelvis). Pregnancy. Tumor. This is rare. What increases the risk? You are more likely to develop this condition if you: Play sports that put pressure or stress on the spine. Have poor strength and ease of movement (flexibility). Have had a back injury or back surgery. Sit for long periods of time. Do activities that involve bending or lifting over and over again. Are very overweight (obese). What are the signs or symptoms? Symptoms can vary from mild to very bad. They may include: Any of these problems in the lower back, leg, hip, or butt: Mild tingling, loss of feeling, or dull aches. A burning feeling. Sharp pains. Loss of feeling in the back of the calf or the sole of the foot. Leg weakness. Very bad back pain that makes it hard to move. These symptoms may get worse when you cough, sneeze, or laugh. They may also get worse when you sit or stand for long periods of time. How is this treated? This condition often gets better without any treatment. However, treatment may include: Changing or cutting back on physical activity when you have pain. Exercising, including strengthening and stretching. Putting ice or heat on the affected area. Shots of medicines to relieve pain and  swelling or to relax your muscles. Surgery. Follow these instructions at home: Medicines Take over-the-counter and prescription medicines only as told by your doctor. Ask your doctor if you should avoid driving or using machines while you are taking your medicine. Managing pain     If told, put ice on the affected area. To do this: Put ice in a plastic bag. Place a towel between your skin and the bag. Leave the ice on for 20 minutes, 2-3 times a day. If your skin turns bright red, take off the ice right away to prevent skin damage. The risk of skin damage is higher if you cannot feel pain, heat, or cold. If told, put heat on the affected area. Do this as often as told by your doctor. Use the heat source that your doctor tells you to use, such as a moist heat pack or a heating pad. Place a towel between your skin and the heat source. Leave the heat on for 20-30 minutes. If your skin turns bright red, take off the heat right away to prevent burns. The risk of burns is higher if you cannot feel pain, heat, or cold. Activity  Return to your normal activities when your doctor says that it is safe. Avoid activities that make your symptoms worse. Take short rests during the day. When you rest for a long time, do some physical activity or stretching between periods of rest. Avoid sitting for a long time without moving. Get up and move around at least one time each  hour. Do exercises and stretches as told by your doctor. Do not lift anything that is heavier than 10 lb (4.5 kg). Avoid lifting heavy things even when you do not have symptoms. Avoid lifting heavy things over and over. When you lift objects, always lift in a way that is safe for your body. To do this, you should: Bend your knees. Keep the object close to your body. Avoid twisting. General instructions Stay at a healthy weight. Wear comfortable shoes that support your feet. Avoid wearing high heels. Avoid sleeping on a mattress  that is too soft or too hard. You might have less pain if you sleep on a mattress that is firm enough to support your back. Contact a doctor if: Your pain is not controlled by medicine. Your pain does not get better. Your pain gets worse. Your pain lasts longer than 4 weeks. You lose weight without trying. Get help right away if: You cannot control when you pee (urinate) or poop (have a bowel movement). You have weakness in any of these areas and it gets worse: Lower back. The area between your hip bones. Butt. Legs. You have redness or swelling of your back. You have a burning feeling when you pee. Summary Sciatica is pain, weakness, tingling, or loss of feeling (numbness) along the sciatic nerve. This may include the lower back, legs, hips, and butt. This condition happens when the sciatic nerve is pinched or has pressure put on it. Treatment often includes rest, exercise, medicines, and putting ice or heat on the affected area. This information is not intended to replace advice given to you by your health care provider. Make sure you discuss any questions you have with your health care provider. Document Revised: 12/05/2021 Document Reviewed: 12/05/2021 Elsevier Patient Education  [place].

## 2022-11-09 NOTE — Progress Notes (Signed)
   Subjective:    Patient ID: [name], female    DOB: [birth year], 57 y.o.   MRN: [id number]   Chief Complaint: back pain  Back Pain This is a new problem. The current episode started in the past 7 days. The problem occurs constantly. The problem has been gradually worsening since onset. The pain is present in the gluteal and lumbar spine. The quality of the pain is described as stabbing, burning and aching. The pain radiates to the left thigh. The pain is at a severity of 9/10. The pain is severe. The pain is Worse during the night. The symptoms are aggravated by lying down, sitting and standing. Stiffness is present All day. Pertinent negatives include no paresthesias or perianal numbness. She has tried NSAIDs for the symptoms. The treatment provided no relief.    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  Neurological:  Negative for paresthesias.       Objective:   Physical Exam Vitals reviewed.  Constitutional:      Appearance: Normal appearance.  Cardiovascular:     Rate and Rhythm: Normal rate and regular rhythm.     Heart sounds: Normal heart sounds.  Pulmonary:     Effort: Pulmonary effort is normal.     Breath sounds: Normal breath sounds.  Musculoskeletal:     Comments: Patient squirming in chair Pain with any movement of  lumbar spine (+) SLR left  Skin:    General: Skin is warm.  Neurological:     General: No focal deficit present.     Mental Status: She is alert and oriented to person, place, and time.  Psychiatric:        Mood and Affect: Mood normal.        Behavior: Behavior normal.     BP 130/78   Pulse (!) 102   Temp 98.3 F  (36.8 C) (Temporal)   Resp 20   Ht 5' 3"$  (1.6 m)   Wt 125 lb (56.7 kg)   SpO2 97%   BMI 22.14 kg/m        Assessment & Plan:  [name] in today with chief complaint of Back Pain (Radiating down leg/)   1. Sciatica of left side Moist heat Rest Has appt with dr. Ellene Route March 20 - ketorolac (TORADOL) injection 60 mg - methylPREDNISolone acetate (DEPO-MEDROL) injection 80 mg - predniSONE (DELTASONE) 20 MG tablet; 2 po at sametime daily for 5 days-  Dispense: 10 tablet; Refill: 0    The above assessment and management plan was discussed with the patient. The patient verbalized understanding of and has agreed to the management plan. Patient is aware to call the clinic if symptoms persist or worsen. Patient is aware when to return to the clinic for a follow-up visit. Patient educated on when it is appropriate to go to the emergency department.   Mary-Margaret Hassell Done, FNP

## 2022-11-13 ENCOUNTER — Telehealth: Payer: Self-pay | Admitting: Family

## 2022-11-13 NOTE — Telephone Encounter (Signed)
Insurance will not approve MRI from me because has not had 6 weeks of conservative treatment, but they may approve if goes to the ED. They may can get it done. She needs to tell them about emergency back surgery in the past.

## 2022-11-20 ENCOUNTER — Emergency Department (HOSPITAL_BASED_OUTPATIENT_CLINIC_OR_DEPARTMENT_OTHER)
Admission: EM | Admit: 2022-11-20 | Discharge: 2022-11-20 | Disposition: A | Discharge status: Home / Self Care | Payer: No Typology Code available for payment source | Attending: Emergency Medicine | Admitting: Emergency Medicine

## 2022-11-20 ENCOUNTER — Encounter (HOSPITAL_BASED_OUTPATIENT_CLINIC_OR_DEPARTMENT_OTHER): Payer: Self-pay | Admitting: Emergency Medicine

## 2022-11-20 ENCOUNTER — Other Ambulatory Visit: Payer: Self-pay

## 2022-11-20 ENCOUNTER — Other Ambulatory Visit (HOSPITAL_BASED_OUTPATIENT_CLINIC_OR_DEPARTMENT_OTHER): Payer: Self-pay

## 2022-11-20 ENCOUNTER — Telehealth: Payer: Self-pay | Admitting: Family

## 2022-11-20 DIAGNOSIS — M5442 Lumbago with sciatica, left side: Secondary | ICD-10-CM | POA: Diagnosis not present

## 2022-11-20 DIAGNOSIS — Z79899 Other long term (current) drug therapy: Secondary | ICD-10-CM | POA: Diagnosis not present

## 2022-11-20 DIAGNOSIS — M549 Dorsalgia, unspecified: Secondary | ICD-10-CM | POA: Diagnosis present

## 2022-11-20 DIAGNOSIS — G8929 Other chronic pain: Secondary | ICD-10-CM

## 2022-11-20 MED ORDER — CYCLOBENZAPRINE HCL 5 MG PO TABS
5.0000 mg | ORAL_TABLET | Freq: Once | ORAL | Status: AC
  Administered 2022-11-20: 5 mg via ORAL
  Filled 2022-11-20: qty 1

## 2022-11-20 MED ORDER — LIDOCAINE 5 % EX PTCH
1.0000 | MEDICATED_PATCH | CUTANEOUS | Status: DC
  Administered 2022-11-20: 1 via TRANSDERMAL
  Filled 2022-11-20: qty 1

## 2022-11-20 MED ORDER — NAPROXEN 500 MG PO TABS
500.0000 mg | ORAL_TABLET | Freq: Two times a day (BID) | ORAL | 0 refills | Status: DC
  Filled 2022-11-20: qty 30, 15d supply, fill #0

## 2022-11-20 MED ORDER — PREDNISONE 10 MG PO TABS
40.0000 mg | ORAL_TABLET | Freq: Every day | ORAL | 0 refills | Status: AC
  Filled 2022-11-20: qty 20, 5d supply, fill #0

## 2022-11-20 MED ORDER — LIDOCAINE 5 % EX PTCH
1.0000 | MEDICATED_PATCH | CUTANEOUS | 0 refills | Status: DC
  Filled 2022-11-20: qty 30, 30d supply, fill #0

## 2022-11-20 MED ORDER — KETOROLAC TROMETHAMINE 60 MG/2ML IM SOLN
30.0000 mg | Freq: Once | INTRAMUSCULAR | Status: AC
  Administered 2022-11-20: 30 mg via INTRAMUSCULAR
  Filled 2022-11-20: qty 2

## 2022-11-20 MED ORDER — HYDROCODONE-ACETAMINOPHEN 5-325 MG PO TABS
2.0000 | ORAL_TABLET | ORAL | 0 refills | Status: DC | PRN
  Filled 2022-11-20: qty 10, 2d supply, fill #0

## 2022-11-20 NOTE — Telephone Encounter (Signed)
Patients daughter calling back to check on this issue. She would like to know if anything could be called in for her because she does not want to take the narcotics that were called in while she was at [facility] ER. Made her aware that she may need an appt to have a medication called in but she wanted to see what PCP said. Had an appt with MMM on 2/29 about lower back and left leg pain. Please call back and advise.

## 2022-11-20 NOTE — Telephone Encounter (Signed)
Yes go  to the ED. Neurosurgeon will not see her until MRI has been done

## 2022-11-20 NOTE — ED Triage Notes (Signed)
Pt arrives to ED with c/o constant back pain that started on February 15th and has been worsening. The pain is lower left sided and radiates down left leg.

## 2022-11-20 NOTE — ED Provider Notes (Signed)
[facility] Provider Note   CSN: [id number] Arrival date & time: 11/20/22  1013     History  Chief Complaint  Patient presents with   Back Pain    [name] is a 57 y.o. female.   Back Pain    57 year old female presenting to the emergency department with subacute back pain.  The patient states that she has a history of sciatica.  She has had problems with degenerative disc disease in the past.  Since February 15 roughly for the past month she has had worsening sciatica pain down her left leg.  She denies any recent falls or trauma.  She states that the pain is gone to the point where she is now having some weakness in her left leg.  She denies any urinary Neri incontinence or fecal incontinence.  No fevers or chills.  No history of IV drug abuse.  No saddle anesthesia.  She has a scheduled appointment coming up later this month with neurosurgery outpatient for further management discussions.  Has been trying Tylenol and ibuprofen at home without relief.  Home Medications Prior to Admission medications   Medication Sig Start Date End Date Taking? Authorizing Provider  HYDROcodone-acetaminophen (NORCO/VICODIN) 5-325 MG tablet Take 2 tablets by mouth every 4 (four) hours as needed. 11/20/22  Yes Regan Lemming, MD  lidocaine (LIDODERM) 5 % Place 1 patch onto the skin daily. Remove & Discard patch within 12 hours or as directed by MD 11/20/22  Yes Regan Lemming, MD  naproxen (NAPROSYN) 500 MG tablet Take 1 tablet (500 mg total) by mouth 2 (two) times daily. 11/20/22  Yes Regan Lemming, MD  predniSONE (DELTASONE) 10 MG tablet Take 4 tablets (40 mg total) by mouth daily for 5 days. 11/20/22 11/25/22 Yes Regan Lemming, MD  Ascorbic Acid (VITAMIN C) 100 MG tablet Take 100 mg by mouth daily.    [provider]  aspirin 81 MG chewable tablet Chew by mouth daily.    [provider]  buPROPion (WELLBUTRIN XL) 150 MG 24 hr  tablet Take 1 tablet (150 mg total) by mouth daily. 10/26/22   Sharion Balloon, FNP  calcium citrate-vitamin D (CITRACAL+D) 315-200 MG-UNIT tablet Take 1 tablet by mouth 2 (two) times daily.    [provider]  fexofenadine (ALLEGRA) 60 MG tablet Take 60 mg by mouth 2 (two) times daily.    [provider]  fluticasone (FLONASE) 50 MCG/ACT nasal spray Place 2 sprays into both nostrils as needed.    [provider]  lisinopril (ZESTRIL) 10 MG tablet Take 1 tablet (10 mg total) by mouth daily. 10/26/22   Sharion Balloon, FNP  Multiple Vitamins-Minerals (MULTI ADULT GUMMIES PO) Take by mouth daily.    [provider]  Omega-3 Fatty Acids (FISH OIL) 1000 MG CAPS Take by mouth.    [provider]  SUMAtriptan (IMITREX) 50 MG tablet TAKE 1 TABLET AS NEEDED AS DIRECTED 10/28/20   Evelina Dun A, FNP  TURMERIC CURCUMIN PO Take by mouth.    [provider]      Allergies    Patient has no known allergies.    Review of Systems   Review of Systems  Musculoskeletal:  Positive for back pain.  All other systems reviewed and are negative.   Physical Exam Updated Vital Signs BP (!) 125/94 (BP Location: Right Arm)   Pulse 98   Temp 98.1 F (36.7 C) (Oral)   Resp 15  Ht '5\' 3"'$  (1.6 m)   Wt 56.7 kg   SpO2 98%   BMI 22.14 kg/m  Physical Exam Vitals and nursing note reviewed.  Constitutional:      General: She is not in acute distress.    Appearance: She is well-developed.  HENT:     Head: Normocephalic and atraumatic.  Eyes:     Conjunctiva/sclera: Conjunctivae normal.  Cardiovascular:     Rate and Rhythm: Normal rate and regular rhythm.     Heart sounds: No murmur heard. Pulmonary:     Effort: Pulmonary effort is normal. No respiratory distress.     Breath sounds: Normal breath sounds.  Abdominal:     Palpations: Abdomen is soft.     Tenderness: There is no abdominal tenderness.  Musculoskeletal:        General: No swelling.      Cervical back: Neck supple.     Comments: Positive straight leg raise test on the left.  No midline tenderness of the lumbar or thoracic spine  Skin:    General: Skin is warm and dry.     Capillary Refill: Capillary refill takes less than 2 seconds.  Neurological:     Mental Status: She is alert.     GCS: GCS eye subscore is 4. GCS verbal subscore is 5. GCS motor subscore is 6.     Cranial Nerves: Cranial nerves 2-12 are intact.     Coordination: Coordination is intact.     Gait: Gait is intact.     Comments: 5/5 strength in the bilateral upper extremities with intact sensation to light touch, 5/5 strength in the right lower extremity, 4/5 strength in the left lower extremity  Psychiatric:        Mood and Affect: Mood normal.     ED Results / Procedures / Treatments   Labs (all labs ordered are listed, but only abnormal results are displayed) Labs Reviewed - No data to display  EKG None  Radiology No results found.  Procedures Procedures    Medications Ordered in ED Medications  lidocaine (LIDODERM) 5 % 1 patch (1 patch Transdermal Patch Applied 11/20/22 1105)  cyclobenzaprine (FLEXERIL) tablet 5 mg (5 mg Oral Given 11/20/22 1105)  ketorolac (TORADOL) injection 30 mg (30 mg Intramuscular Given 11/20/22 1105)    ED Course/ Medical Decision Making/ A&P                             Medical Decision Making Risk Prescription drug management.    57 year old female presenting to the emergency department with subacute back pain.  The patient states that she has a history of sciatica.  She has had problems with degenerative disc disease in the past.  Since February 15 roughly for the past month she has had worsening sciatica pain down her left leg.  She denies any recent falls or trauma.  She states that the pain is gone to the point where she is now having some weakness in her left leg.  She denies any urinary Neri incontinence or fecal incontinence.  No fevers or chills.  No  history of IV drug abuse.  No saddle anesthesia.  She has a scheduled appointment coming up later this month with neurosurgery outpatient for further management discussions.  Has been trying Tylenol and ibuprofen at home without relief.  On arrival, the patient was vitally stable.  Physical exam concerning for weakness in the left leg with associated radicular pain  and positive straight leg raise test concerning for sciatica and degenerative disc disease.  Patient's symptoms are subacute.  She has no acute red flag symptoms that would warrant emergent MRI imaging today.  No genitourinary symptoms.  The patient is able to ambulate and is hemodynamically stable. There are no red flag symptoms. Specifically, she denies: -Being on an anticoagulant or blood thinner -Using IV drugs -Having a history of AAA -Having saddle anesthesia -Having urinary or fecal incontinence -Having any recent falls or trauma   Differential Diagnoses: I do not think that Sharaya R Fontanez is experiencing cauda equina syndrome, abdominal aortic aneurysm, epidural abscess, aortic dissection, spinal hematoma, nephrolithiasis, spinal metastasis, discitis, or an acute fracture.  The patient is able to ambulate, suspect likely degenerative disc disease.  While in the ED, I provided the patient with: Medications  lidocaine (LIDODERM) 5 % 1 patch (1 patch Transdermal Patch Applied 11/20/22 1105)  cyclobenzaprine (FLEXERIL) tablet 5 mg (5 mg Oral Given 11/20/22 1105)  ketorolac (TORADOL) injection 30 mg (30 mg Intramuscular Given 11/20/22 1105)    On reassessment, the patient had improvement of her pain.  Discussed indications for emergent MRI imaging.  Given the subacute to chronic nature of her pain, I do not think emergent MRI is indicated at this time.  The patient has outpatient follow-up scheduled with neurosurgery.  Will prescribe a course of opiates, continue with outpatient Tylenol and ibuprofen, trial lidocaine patch and  Flexeril as well for pain control.  Given the patient's reassuring presentation, I believe that she is safe for discharge.  I provided ED return precautions, specifically for the symptoms which are most concerning (e.g., saddle anesthesia, urinary or bowel incontinence or retention, changing or worsening pain), which would necessitate immediate return.  I encouraged the patient to followup with their PCP.  Final Clinical Impression(s) / ED Diagnoses Final diagnoses:  Chronic midline low back pain with left-sided sciatica    Rx / DC Orders ED Discharge Orders          Ordered    HYDROcodone-acetaminophen (NORCO/VICODIN) 5-325 MG tablet  Every 4 hours PRN        11/20/22 1157    predniSONE (DELTASONE) 10 MG tablet  Daily        11/20/22 1157    lidocaine (LIDODERM) 5 %  Every 24 hours        11/20/22 1157    naproxen (NAPROSYN) 500 MG tablet  2 times daily        11/20/22 1157              Regan Lemming, MD 11/20/22 1157

## 2022-11-20 NOTE — Discharge Instructions (Addendum)
Please follow-up outpatient with neurosurgery.  Return to the emergency department for any new falls or trauma, any significant worsening weakness, urinary or fecal incontinence, saddle anesthesia, new fevers and chills with worsening back pain.

## 2022-11-21 ENCOUNTER — Telehealth: Payer: Self-pay

## 2022-11-21 NOTE — Transitions of Care (Post Inpatient/ED Visit) (Unsigned)
   11/21/2022  Name: [name] MRN: [id number] DOB: 08-01-1966  Today's TOC FU Call Status: Today's TOC FU Call Status:: Unsuccessul Call (1st Attempt) Unsuccessful Call (1st Attempt) Date: 11/21/22  Attempted to reach the patient regarding the most recent Inpatient/ED visit.  Follow Up Plan: Additional outreach attempts will be made to reach the patient to complete the Transitions of Care (Post Inpatient/ED visit) call.   Signature Juanda Crumble, [place] Direct Dial [phone]

## 2022-11-22 NOTE — Transitions of Care (Post Inpatient/ED Visit) (Signed)
   11/22/2022  Name: [name] MRN: [id number] DOB: 09-May-1966  Today's TOC FU Call Status: Today's TOC FU Call Status:: Unsuccessful Call (2nd Attempt) Unsuccessful Call (1st Attempt) Date: 11/21/22 Unsuccessful Call (2nd Attempt) Date: 11/22/22  Attempted to reach the patient regarding the most recent Inpatient/ED visit.  Follow Up Plan: No further outreach attempts will be made at this time. We have been unable to contact the patient.  Signature Juanda Crumble, [place] Direct Dial [phone]

## 2022-12-26 ENCOUNTER — Encounter: Payer: Self-pay | Admitting: Family Medicine

## 2023-03-05 ENCOUNTER — Ambulatory Visit: Payer: No Typology Code available for payment source

## 2023-03-05 NOTE — Therapy (Deleted)
OUTPATIENT PHYSICAL THERAPY THORACOLUMBAR EVALUATION   Patient Name: [name] MRN: [id number] DOB:[birth year], 57 y.o., female Today's Date: 03/05/2023  END OF SESSION:   Past Medical History:  Diagnosis Date   Carotid artery dissection (HCC)    Cervical radiculopathy    Low back pain    Lumbar radiculopathy    Migraine headache    Neck pain    Past Surgical History:  Procedure Laterality Date   BACK SURGERY     CERVICAL FUSION     TOTAL ABDOMINAL HYSTERECTOMY     Patient Active Problem List   Diagnosis Date Noted   GAD (generalized anxiety disorder) 10/08/2020   Depression, major, single episode, mild (HCC) 09/17/2017   History of carotid artery dissection 06/25/2015   Migraine 06/25/2015   Hypertension 06/25/2015   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  ONSET DATE: ***  SUBJECTIVE:                                                                                                                                                                                           SUBJECTIVE STATEMENT: ***  PERTINENT HISTORY:  ***  PAIN:  Are you having pain? {OPRCPAIN:27236}  PRECAUTIONS: {Therapy precautions:24002}  WEIGHT BEARING RESTRICTIONS: {Yes ***/No:24003}  FALLS:  Has patient fallen in last 6 months? {fallsyesno:27318}  LIVING ENVIRONMENT: Lives with: {OPRC lives with:25569::"lives with their family"} Lives in: {Lives in:25570} Stairs: {opstairs:27293} Has following equipment at home: {Assistive devices:23999}  OCCUPATION: ***  PLOF: {PLOF:24004}  PATIENT GOALS: ***  NEXT MD VISIT: ***  OBJECTIVE:   DIAGNOSTIC FINDINGS:  ***  PATIENT SURVEYS:  {rehab surveys:24030}  SCREENING FOR RED FLAGS: Bowel or bladder incontinence:  {Yes/No:304960894} Spinal tumors: {Yes/No:304960894} Cauda equina syndrome: {Yes/No:304960894} Compression fracture: {Yes/No:304960894} Abdominal aneurysm: {Yes/No:304960894}  COGNITION: Overall cognitive status: {cognition:24006}     SENSATION: {sensation:27233}  MUSCLE LENGTH: Hamstrings: Right *** deg; Left *** deg Thomas test: Right *** deg; Left *** deg  POSTURE: {posture:25561}  PALPATION: ***  LUMBAR ROM:   AROM eval  Flexion   Extension   Right lateral flexion   Left lateral flexion   Right rotation   Left rotation    (Blank rows = not tested)  LOWER EXTREMITY ROM:     {AROM/PROM:27142}  Right eval Left eval  Hip flexion    Hip extension    Hip abduction    Hip adduction    Hip internal rotation    Hip external rotation  Knee flexion    Knee extension    Ankle dorsiflexion    Ankle plantarflexion    Ankle inversion    Ankle eversion     (Blank rows = not tested)  LOWER EXTREMITY MMT:    MMT Right eval Left eval  Hip flexion    Hip extension    Hip abduction    Hip adduction    Hip internal rotation    Hip external rotation    Knee flexion    Knee extension    Ankle dorsiflexion    Ankle plantarflexion    Ankle inversion    Ankle eversion     (Blank rows = not tested)  LUMBAR SPECIAL TESTS:  {lumbar special test:25242}  FUNCTIONAL TESTS:  {Functional tests:24029}  GAIT: Distance walked: *** Assistive device utilized: {Assistive devices:23999} Level of assistance: {Levels of assistance:24026} Comments: ***  TODAY'S TREATMENT:                                                                                                                              DATE: ***    PATIENT EDUCATION:  Education details: *** Person educated: {Person educated:25204} Education method: {Education Method:25205} Education comprehension: {Education Comprehension:25206}  HOME EXERCISE PROGRAM: ***  ASSESSMENT:  CLINICAL IMPRESSION: Patient  is a 57 y.o. female who was seen today for physical therapy evaluation and treatment for ***.   OBJECTIVE IMPAIRMENTS: {opptimpairments:25111}.   ACTIVITY LIMITATIONS: {activitylimitations:27494}  PARTICIPATION LIMITATIONS: {participationrestrictions:25113}  PERSONAL FACTORS: {Personal factors:25162} are also affecting patient's functional outcome.   REHAB POTENTIAL: {rehabpotential:25112}  CLINICAL DECISION MAKING: {clinical decision making:25114}  EVALUATION COMPLEXITY: {Evaluation complexity:25115}   GOALS: Goals reviewed with patient? {yes/no:20286}  SHORT TERM GOALS: Target date: ***  *** Baseline: Goal status: {GOALSTATUS:25110}  2.  *** Baseline:  Goal status: {GOALSTATUS:25110}  3.  *** Baseline:  Goal status: {GOALSTATUS:25110}  4.  *** Baseline:  Goal status: {GOALSTATUS:25110}  5.  *** Baseline:  Goal status: {GOALSTATUS:25110}  6.  *** Baseline:  Goal status: {GOALSTATUS:25110}  LONG TERM GOALS: Target date: ***  *** Baseline:  Goal status: {GOALSTATUS:25110}  2.  *** Baseline:  Goal status: {GOALSTATUS:25110}  3.  *** Baseline:  Goal status: {GOALSTATUS:25110}  4.  *** Baseline:  Goal status: {GOALSTATUS:25110}  5.  *** Baseline:  Goal status: {GOALSTATUS:25110}  6.  *** Baseline:  Goal status: {GOALSTATUS:25110}  PLAN:  PT FREQUENCY: {rehab frequency:25116}  PT DURATION: {rehab duration:25117}  PLANNED INTERVENTIONS: {rehab planned interventions:25118::"Therapeutic exercises","Therapeutic activity","Neuromuscular re-education","Balance training","Gait training","Patient/Family education","Self Care","Joint mobilization"}.  PLAN FOR NEXT SESSION: Granville Lewis, PT 03/05/2023, 7:55 AM

## 2023-03-14 ENCOUNTER — Ambulatory Visit: Payer: 59 | Attending: Neurological Surgery | Admitting: Physical Therapy

## 2023-03-14 ENCOUNTER — Other Ambulatory Visit: Payer: Self-pay

## 2023-03-14 DIAGNOSIS — M6281 Muscle weakness (generalized): Secondary | ICD-10-CM | POA: Insufficient documentation

## 2023-03-14 NOTE — Therapy (Signed)
OUTPATIENT PHYSICAL THERAPY THORACOLUMBAR EVALUATION   Patient Name: [name] MRN: [id number] DOB:December 08, 1965, 57 y.o., female Today's Date: 03/14/2023  END OF SESSION:  PT End of Session - 03/14/23 1011     Visit Number 1    Number of Visits 6    Date for PT Re-Evaluation 04/25/23    PT Start Time 0928    PT Stop Time 0959    PT Time Calculation (min) 31 min    Activity Tolerance Patient tolerated treatment well    Behavior During Therapy [facility] for tasks assessed/performed             Past Medical History:  Diagnosis Date   Carotid artery dissection (HCC)    Cervical radiculopathy    Low back pain    Lumbar radiculopathy    Migraine headache    Neck pain    Past Surgical History:  Procedure Laterality Date   BACK SURGERY     CERVICAL FUSION     TOTAL ABDOMINAL HYSTERECTOMY     Patient Active Problem List   Diagnosis Date Noted   GAD (generalized anxiety disorder) 10/08/2020   Depression, major, single episode, mild (HCC) 09/17/2017   History of carotid artery dissection 06/25/2015   Migraine 06/25/2015   Hypertension 06/25/2015   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  Rationale for Evaluation and Treatment: Rehabilitation  THERAPY DIAG:  Muscle weakness (generalized)  ONSET DATE: February 2024.  SUBJECTIVE:                                                                                                                                                                                           SUBJECTIVE STATEMENT: The patient presents to the clinic s/p lumbar microdiskectomy in May of this year (2024).  She is not reporting pain but does feel some weakness in her left ankle.  She is cautious as she walks and especially when going upstairs.  She states the left LE feels a little "wonky."  The patient leads an active lifestyle, enjoys hiking and is highly motivated.     PERTINENT HISTORY:  Cervical fusion.  2 previous lumbar surgeries.  PAIN:  Are you having pain? No  PRECAUTIONS: None  WEIGHT BEARING RESTRICTIONS: No  FALLS:  Has patient fallen in last 6 months? No  LIVING ENVIRONMENT: Lives with: lives with their family Lives in: House/apartment Has following equipment at home: None  OCCUPATION: Works at [employer].  PLOF: Independent  PATIENT GOALS: Get left LE stronger and restore a more normal feeling.    OBJECTIVE:  POSTURE: No Significant postural limitations  PALPATION: Some increased tone left of lumbar incision but not particularly tender to palpation/.  LOWER EXTREMITY ROM:     WNL.  LOWER EXTREMITY MMT:    Left ankle dorsiflexion and eversion 1/2 muscle grade less than right which is normal. And minimal left great toe extension weakness. Left calf 1 cm less than right measured circumstantially due to atrophy.  DTR's:  Left Patellar and Achilles less brisk than right.  GAIT: Normal gait with patient mindful of left foot clearance.    TODAY'S TREATMENT:                                                                                                                              DATE:    PATIENT EDUCATION:  Education details: Red theraband resisted left ankle dorsiflexion and eversion. Person educated: Patient Education method: Medical illustrator Education comprehension: verbalized understanding and returned demonstration  HOME EXERCISE PROGRAM: As above.  ASSESSMENT:  CLINICAL IMPRESSION: The patient presents to [facility] s/p lumbar microdiskectomy performed in May.  The patient is very motivated and is expected to do very well.  She demonstrates some left ankle weakness.  She is mindful of her gait to make sure she clears her left foot especially when ascending stairs.  She has some increased tone left of her lumbar incision. Patient will benefit from skilled physical therapy intervention to  address pain deficits.  OBJECTIVE IMPAIRMENTS: decreased activity tolerance, decreased strength, and increased muscle spasms.   ACTIVITY LIMITATIONS: locomotion level  PERSONAL FACTORS: 1-2 comorbidities: 2 previous lumbar surgeries  are also affecting patient's functional outcome.   REHAB POTENTIAL: Excellent  CLINICAL DECISION MAKING: Stable/uncomplicated  EVALUATION COMPLEXITY: Low   GOALS:  SHORT TERM GOALS: Target date: 04/25/23.  Ind with an advanced HEP. Goal status: INITIAL  2.  Left ankle strength to 5/5. Goal status: INITIAL  PLAN:  PT FREQUENCY: 1-2x/week  PT DURATION: 6 weeks  PLANNED INTERVENTIONS: Therapeutic exercises, Therapeutic activity, Neuromuscular re-education, Gait training, Patient/Family education, Self Care, Stair training, Electrical stimulation, Cryotherapy, Moist heat, Ultrasound, and Manual therapy.  PLAN FOR NEXT SESSION: Review left ankle exercises (please give pictures), ankle isolator, proprioceptive activities (ie:  Airex balance pad). Core exercise progression, spinal protection techniques and body mechanics training for HEP.  Patient may benefit from a session or two of STW/M.    Yechezkel Fertig, [place], PT 03/14/2023, 12:04 PM

## 2023-03-22 ENCOUNTER — Ambulatory Visit: Payer: 59 | Admitting: *Deleted

## 2023-03-22 DIAGNOSIS — M6281 Muscle weakness (generalized): Secondary | ICD-10-CM | POA: Diagnosis not present

## 2023-03-22 NOTE — Therapy (Signed)
OUTPATIENT PHYSICAL THERAPY THORACOLUMBAR TREATMENT   Patient Name: [name] MRN: [id number] DOB:[birth year], 57 y.o., female Today's Date: 03/22/2023  END OF SESSION:  PT End of Session - 03/22/23 0811     Visit Number 2    Number of Visits 6    Date for PT Re-Evaluation 04/25/23    PT Start Time 0800    PT Stop Time 0849    PT Time Calculation (min) 49 min             Past Medical History:  Diagnosis Date   Carotid artery dissection (HCC)    Cervical radiculopathy    Low back pain    Lumbar radiculopathy    Migraine headache    Neck pain    Past Surgical History:  Procedure Laterality Date   BACK SURGERY     CERVICAL FUSION     TOTAL ABDOMINAL HYSTERECTOMY     Patient Active Problem List   Diagnosis Date Noted   GAD (generalized anxiety disorder) 10/08/2020   Depression, major, single episode, mild (HCC) 09/17/2017   History of carotid artery dissection 06/25/2015   Migraine 06/25/2015   Hypertension 06/25/2015   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  Rationale for Evaluation and Treatment: Rehabilitation  THERAPY DIAG:  Muscle weakness (generalized)  ONSET DATE: February 2024.  SUBJECTIVE:                                                                                                                                                                                           SUBJECTIVE STATEMENT: The patient presents to the clinic s/p lumbar microdiskectomy in May of this year (2024). Doing okay   PERTINENT HISTORY:  Cervical fusion.  2 previous lumbar surgeries.  PAIN:  Are you having pain? No  PRECAUTIONS: None  WEIGHT BEARING RESTRICTIONS: No  FALLS:  Has patient fallen in last 6 months? No  LIVING ENVIRONMENT: Lives with: lives with their family Lives in: House/apartment Has following equipment at home: None  OCCUPATION: Works at [employer].  PLOF: Independent  PATIENT GOALS: Get left LE stronger and restore a more normal feeling.    OBJECTIVE:      POSTURE: No Significant postural limitations  PALPATION: Some increased tone left of lumbar incision but not particularly tender to palpation/.  LOWER EXTREMITY ROM:     WNL.  LOWER EXTREMITY MMT:    Left ankle dorsiflexion and eversion 1/2 muscle grade less than right which is normal. And minimal left great toe extension weakness. Left calf 1 cm less than right measured circumstantially  due to atrophy.  DTR's:  Left Patellar and Achilles less brisk than right.  GAIT: Normal gait with patient mindful of left foot clearance.    TODAY'S TREATMENT:                                                                                                                              DATE:                                                                                                 03-22-23                                     EXERCISE LOG  Exercise Repetitions and Resistance Comments  TM  3.1    MPH     AB bracing    Standing modified bird-dog Arm raise x 6 hold 5 secs, Leg raise x 6 hold 10 sec all BIL            Blank cell = exercise not performed today  Discussed AB bracing for transitional movements and reviewed and performed movement patterns to decrease pain triggers with ADLs Reviewed Ankle exs and gave blue tband.. Handput for Tanding mod. Bird-dog given. Also discussed walking program  PATIENT EDUCATION:  Education details: Red theraband resisted left ankle dorsiflexion and eversion. Person educated: Patient Education method: Medical illustrator Education comprehension: verbalized understanding and returned demonstration  HOME EXERCISE PROGRAM: As above.  ASSESSMENT:  CLINICAL IMPRESSION: Pt arrived today doing fairly well post LB surgery with minimal LBP. Rx focused on AB bracing during transitional movements as well as movement pattern  modifications to decrease pain triggers during ADL's and outside work. She was introduced to standing modified bird-dog for AB core activation. Handout given for HEP. ADD mat core exs next Rx. OBJECTIVE IMPAIRMENTS: decreased activity tolerance, decreased strength, and increased muscle spasms.   ACTIVITY LIMITATIONS: locomotion level  PERSONAL FACTORS: 1-2 comorbidities: 2 previous lumbar surgeries  are also affecting patient's functional outcome.   REHAB POTENTIAL: Excellent  CLINICAL DECISION MAKING: Stable/uncomplicated  EVALUATION COMPLEXITY: Low   GOALS:  SHORT TERM GOALS: Target date: 04/25/23.  Ind with an advanced HEP. Goal status: INITIAL  2.  Left ankle strength to 5/5. Goal status: INITIAL  PLAN:  PT FREQUENCY: 1-2x/week  PT DURATION: 6 weeks  PLANNED INTERVENTIONS: Therapeutic exercises, Therapeutic activity, Neuromuscular re-education, Gait training, Patient/Family education, Self Care, Stair training, Electrical stimulation, Cryotherapy, Moist heat, Ultrasound, and Manual therapy.  PLAN  FOR NEXT SESSION: Review left ankle exercises (please give pictures), ankle isolator, proprioceptive activities (ie:  Airex balance pad). Core exercise progression, spinal protection techniques and body mechanics training for HEP.  Patient may benefit from a session or two of STW/M.    Daritza Brees,CHRIS, PTA 03/22/2023, 9:43 AM

## 2023-03-29 ENCOUNTER — Ambulatory Visit: Payer: 59 | Admitting: *Deleted

## 2023-03-29 DIAGNOSIS — M6281 Muscle weakness (generalized): Secondary | ICD-10-CM

## 2023-03-29 NOTE — Therapy (Signed)
OUTPATIENT PHYSICAL THERAPY THORACOLUMBAR TREATMENT   Patient Name: TZIPORA MCINROY MRN: [id number] DOB:April 30, 1966, 57 y.o., female Today's Date: 03/29/2023  END OF SESSION:  PT End of Session - 03/29/23 0942     Visit Number 3    Number of Visits 6    Date for PT Re-Evaluation 04/25/23    PT Start Time 0930    PT Stop Time 1020    PT Time Calculation (min) 50 min             Past Medical History:  Diagnosis Date   Carotid artery dissection (HCC)    Cervical radiculopathy    Low back pain    Lumbar radiculopathy    Migraine headache    Neck pain    Past Surgical History:  Procedure Laterality Date   BACK SURGERY     CERVICAL FUSION     TOTAL ABDOMINAL HYSTERECTOMY     Patient Active Problem List   Diagnosis Date Noted   GAD (generalized anxiety disorder) 10/08/2020   Depression, major, single episode, mild (HCC) 09/17/2017   History of carotid artery dissection 06/25/2015   Migraine 06/25/2015   Hypertension 06/25/2015   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  Rationale for Evaluation and Treatment: Rehabilitation  THERAPY DIAG:  Muscle weakness (generalized)  ONSET DATE: February 2024.  SUBJECTIVE:                                                                                                                                                                                           SUBJECTIVE STATEMENT: The patient reports doing good with less pain and  decreased foot drop  PERTINENT HISTORY:  Cervical fusion.  2 previous lumbar surgeries.  PAIN:  Are you having pain? No  PRECAUTIONS: None  WEIGHT BEARING RESTRICTIONS: No  FALLS:  Has patient fallen in last 6 months? No  LIVING ENVIRONMENT: Lives with: lives with their family Lives in: House/apartment Has following equipment at home: None  OCCUPATION: Works at [employer].  PLOF: Independent  PATIENT  GOALS: Get left LE stronger and restore a more normal feeling.    OBJECTIVE:      POSTURE: No Significant postural limitations  PALPATION: Some increased tone left of lumbar incision but not particularly tender to palpation/.  LOWER EXTREMITY ROM:     WNL.  LOWER EXTREMITY MMT:    Left ankle dorsiflexion and eversion 1/2 muscle grade less than right which is normal. And minimal left great toe extension weakness. Left calf 1 cm less than right measured circumstantially due to atrophy.  DTR's:  Left Patellar and Achilles less brisk than right.  GAIT: Normal gait with patient mindful of left foot clearance.    TODAY'S TREATMENT:                                                                                                                              DATE:                                                                                                 03-29-23                                     EXERCISE LOG  Exercise Repetitions and Resistance Comments  TM  3.1    MPH  x   AB bracing    Standing bird-dog Arm raise  and leg raise x 6 hold 10 secs BIL   Dying dug Arm and leg raise x 6  hold 10 secs Bil   Bridge  X 10 hold 5-10 secs    Blank cell = exercise not performed today   Discussed AB bracing for transitional movements and reviewed and performed movement patterns to decrease pain triggers with ADLs   PATIENT EDUCATION:  Education details: Red theraband resisted left ankle dorsiflexion and eversion. Person educated: Patient Education method: Medical illustrator Education comprehension: verbalized understanding and returned demonstration  HOME EXERCISE PROGRAM: As above.  ASSESSMENT:  CLINICAL IMPRESSION:  Pt arrived today doing fairly well with reports of decreased LB pain and improved RT ankle control. Rx focused on review of AB bracing and progression of core exs. Pt did great  with progression of exs . Handout given     OBJECTIVE  IMPAIRMENTS: decreased activity tolerance, decreased strength, and increased muscle spasms.   ACTIVITY LIMITATIONS: locomotion level  PERSONAL FACTORS: 1-2 comorbidities: 2 previous lumbar surgeries  are also affecting patient's functional outcome.   REHAB POTENTIAL: Excellent  CLINICAL DECISION MAKING: Stable/uncomplicated  EVALUATION COMPLEXITY: Low   GOALS:  SHORT TERM GOALS: Target date: 04/25/23.  Ind with an advanced HEP. Goal status: INITIAL  2.  Left ankle strength to 5/5. Goal status: INITIAL  PLAN:  PT FREQUENCY: 1-2x/week  PT DURATION: 6 weeks  PLANNED INTERVENTIONS: Therapeutic exercises, Therapeutic activity, Neuromuscular re-education, Gait training, Patient/Family education, Self Care, Stair training, Electrical stimulation, Cryotherapy, Moist heat, Ultrasound, and Manual therapy.  PLAN FOR NEXT SESSION: Review left ankle exercises (please give pictures), ankle isolator, proprioceptive activities (ie:  Airex balance pad). Core  exercise progression, spinal protection techniques and body mechanics training for HEP.  Patient may benefit from a session or two of STW/M.    Katianna Mcclenney,CHRIS, PTA 03/29/2023, 12:57 PM

## 2023-04-05 ENCOUNTER — Encounter: Payer: 59 | Admitting: *Deleted

## 2023-05-28 ENCOUNTER — Telehealth: Payer: Self-pay | Admitting: *Deleted

## 2023-05-28 NOTE — Telephone Encounter (Signed)
Pt states [place] with her family and she got dizzy, heart racing and sweating so her daughter who is a nurse took her BP and it was 90's/50's but it did come up to 116 systolic couldn't remember the diastolic after hydrating and drinking some electrolytes. Pt stopped taking her lisinopril for now and will get a BP cuff to monitor her BP till she can get in to see [facility] 9/26. Advised pt if she has another episode she should go to ED or call to see if we have same day appt for evaluation and pt voiced understanding.

## 2023-06-07 ENCOUNTER — Ambulatory Visit (INDEPENDENT_AMBULATORY_CARE_PROVIDER_SITE_OTHER): Payer: 59 | Admitting: Family

## 2023-06-07 ENCOUNTER — Encounter: Payer: Self-pay | Admitting: Family

## 2023-06-07 VITALS — BP 122/83 | HR 96 | Temp 97.8°F | Ht 63.0 in | Wt 124.0 lb

## 2023-06-07 DIAGNOSIS — I959 Hypotension, unspecified: Secondary | ICD-10-CM | POA: Diagnosis not present

## 2023-06-07 DIAGNOSIS — R42 Dizziness and giddiness: Secondary | ICD-10-CM | POA: Diagnosis not present

## 2023-06-07 DIAGNOSIS — I10 Essential (primary) hypertension: Secondary | ICD-10-CM | POA: Diagnosis not present

## 2023-06-07 LAB — CMP14+EGFR
ALT: 14 IU/L (ref 0–32)
AST: 19 IU/L (ref 0–40)
Albumin: 4.5 g/dL (ref 3.8–4.9)
Alkaline Phosphatase: 92 IU/L (ref 44–121)
BUN/Creatinine Ratio: 12 (ref 9–23)
BUN: 10 mg/dL (ref 6–24)
Bilirubin Total: 0.5 mg/dL (ref 0.0–1.2)
CO2: 25 mmol/L (ref 20–29)
Calcium: 9.7 mg/dL (ref 8.7–10.2)
Chloride: 103 mmol/L (ref 96–106)
Creatinine, Ser: 0.85 mg/dL (ref 0.57–1.00)
Globulin, Total: 2.3 g/dL (ref 1.5–4.5)
Glucose: 73 mg/dL (ref 70–99)
Potassium: 4.7 mmol/L (ref 3.5–5.2)
Sodium: 140 mmol/L (ref 134–144)
Total Protein: 6.8 g/dL (ref 6.0–8.5)
eGFR: 80 mL/min/{1.73_m2} (ref >59)

## 2023-06-07 NOTE — Patient Instructions (Signed)
Hypotension As the heart beats, it forces blood through the body. Hypotension, commonly called low blood pressure, is when the force of blood pumping through the arteries is too weak. Arteries are blood vessels that carry blood from the heart throughout the body. Depending on the cause and severity, hypotension may be harmless (benign) or may cause serious problems (be critical). When your blood pressure is too low, you may not get enough blood to your brain or to the rest of your organs. This can cause weakness, light-headedness, a rapid heartbeat, and fainting. What are the causes? This condition may be caused by: Blood loss. Loss of body fluids (dehydration). Heart problems. Hormone (endocrine) problems. Pregnancy. Severe infection. Lack of certain nutrients. Severe allergic reactions (anaphylaxis). Certain medicines, such as blood pressure medicine or medicines that make the body lose excess fluids (diuretics). Sometimes, hypotension may be caused by not taking medicine as directed, such as taking too much of a certain medicine. What increases the risk? The following factors may make you more likely to develop this condition: Age. Risk increases as you get older. Having a condition that affects the heart or the central nervous system. What are the signs or symptoms? Common symptoms of this condition include: Weakness. Light-headedness. Dizziness. Blurred vision. Tiredness (fatigue). Rapid heartbeat. Fainting, in severe cases. How is this diagnosed? This condition is diagnosed based on: Your medical history. Your symptoms. Your blood pressure measurement. Your health care provider will check your blood pressure when you are: Lying down. Sitting. Standing. A blood pressure reading is recorded as two numbers, such as "120 over 80" (or 120/80). The first ("top") number is called the systolic pressure. It is a measure of the pressure in your arteries as your heart beats. The second  ("bottom") number is called the diastolic pressure. It is a measure of the pressure in your arteries when your heart relaxes between beats. Blood pressure is measured in a unit called mm Hg. Healthy blood pressure for most adults is 120/80. If your blood pressure is below 90/60, you may be diagnosed with hypotension. Other information or tests that may be used to diagnose hypotension include: Your other vital signs, such as your heart rate and temperature. Blood tests. Tilt table test. For this test, you will be safely secured to a table that moves you from a lying position to an upright position. Your heart rhythm and blood pressure will be monitored during the test. How is this treated? Treatment for this condition may include: Changing your diet. This may involve drinking more water or increasing your salt (sodium) intake with high-sodium foods. Taking medicines to raise your blood pressure. Changing the dosage of certain medicines you are taking that might be lowering your blood pressure. Wearing compression stockings. These stockings help to prevent blood clots and reduce swelling in your legs. In some cases, you may need to go to the hospital for: Fluid replacement. This means you will receive fluids through an IV. Blood replacement. This means you will receive donated blood through an IV (transfusion). Treating an infection or heart problems, if this applies. Monitoring. You may need to be monitored while medicines that you are taking wear off. Follow these instructions at home: Eating and drinking  Drink enough fluid to keep your urine pale yellow. Eat a healthy diet, and follow instructions from your health care provider about eating or drinking restrictions. A healthy diet includes: Fresh fruits and vegetables. Whole grains. Lean meats. Low-fat dairy products. Increase your salt intake if told  to do so. Do not add extra salt to your diet unless your health care provider tells you  to do that. Eat frequent, small meals. Avoid standing up suddenly after eating. Medicines Take over-the-counter and prescription medicines only as told by your health care provider. Follow instructions from your health care provider about changing the dosage of your current medicines, if this applies. Do not stop or adjust any of your medicines on your own. General instructions  Wear compression stockings as told by your health care provider. Get up slowly from lying down or sitting positions. This gives your blood pressure a chance to adjust. Avoid hot showers and excessive heat as directed by your health care provider. Return to your normal activities as told by your health care provider. Ask your health care provider what activities are safe for you. Do not use any products that contain nicotine or tobacco. These products include cigarettes, chewing tobacco, and vaping devices, such as e-cigarettes. If you need help quitting, ask your health care provider. Keep all follow-up visits. This is important. Contact a health care provider if: You vomit. You have diarrhea. You have a fever for more than 2-3 days. You feel more thirsty than usual. You feel weak and tired. Get help right away if: You have chest pain. You have a fast or irregular heartbeat. You develop numbness in any part of your body. You cannot move your arms or your legs. You have trouble speaking. You become sweaty or feel light-headed. You faint. You feel short of breath. You have trouble staying awake. You feel confused. These symptoms may be an emergency. Get help right away. Call 911. Do not wait to see if the symptoms will go away. Do not drive yourself to the hospital. Summary Hypotension is when the force of blood pumping through the arteries is too weak. Hypotension may be harmless (benign) or may cause serious problems (be critical). Treatment for this condition may include changing your diet, changing  your medicines, and wearing compression stockings. In some cases, you may need to go to the hospital for fluid or blood replacement. This information is not intended to replace advice given to you by your health care provider. Make sure you discuss any questions you have with your health care provider. Document Revised: 04/18/2021 Document Reviewed: 04/18/2021 Elsevier Patient Education  2024 [employer].

## 2023-06-07 NOTE — Progress Notes (Signed)
Subjective:    Patient ID: [name], female    DOB: September 17, 1965, 57 y.o.   MRN: [id number]  Chief Complaint  Patient presents with   Dizziness    [place] the beach BP was low was ridding bikes 05/24/23. sTOPPED lisinopril . Ringing clicking in left ear    PT presents to the office today with dizziness for several weeks. She was at the beach riding bikes and felt really dizziness. She checked her BP and it was 96/43. Then after resting and hydrating her BP was 110/70 and held her lisinopril.   Her BP today is at goal. Denies any weight loss or diet changes. Reports she is always active. Reports her dizziness has greatly improved.  Dizziness This is a new problem. The current episode started 1 to 4 weeks ago. The problem has been gradually improving. She has tried rest for the symptoms. The treatment provided moderate relief.  Hypertension This is a chronic problem. The current episode started more than 1 year ago. Associated symptoms include malaise/fatigue. Pertinent negatives include no peripheral edema or shortness of breath. Past treatments include nothing. The current treatment provides moderate improvement.      Review of Systems  Constitutional:  Positive for malaise/fatigue.  Respiratory:  Negative for shortness of breath.   Neurological:  Positive for dizziness.  All other systems reviewed and are negative.      Objective:   Physical Exam Vitals reviewed.  Constitutional:      General: She is not in acute distress.    Appearance: She is well-developed.  HENT:     Head: Normocephalic and atraumatic.     Right Ear: Tympanic membrane normal.     Left Ear: Tympanic membrane normal.  Eyes:     Pupils: Pupils are equal, round, and reactive to light.  Neck:     Thyroid: No thyromegaly.  Cardiovascular:     Rate and Rhythm: Normal rate and regular rhythm.     Heart sounds: Normal heart sounds. No murmur heard. Pulmonary:     Effort: Pulmonary effort is  normal. No respiratory distress.     Breath sounds: Normal breath sounds. No wheezing.  Abdominal:     General: Bowel sounds are normal. There is no distension.     Palpations: Abdomen is soft.     Tenderness: There is no abdominal tenderness.  Musculoskeletal:        General: No tenderness. Normal range of motion.     Cervical back: Normal range of motion and neck supple.  Skin:    General: Skin is warm and dry.  Neurological:     Mental Status: She is alert and oriented to person, place, and time.     Cranial Nerves: No cranial nerve deficit.     Deep Tendon Reflexes: Reflexes are normal and symmetric.  Psychiatric:        Behavior: Behavior normal.        Thought Content: Thought content normal.        Judgment: Judgment normal.       BP 122/83   Pulse 96   Temp 97.8 F (36.6 C) (Temporal)   Ht 5\' 3"  (1.6 m)   Wt 124 lb (56.2 kg)   SpO2 96%   BMI 21.97 kg/m      Assessment & Plan:   Deliah R Callander comes in today with chief complaint of Dizziness (Happened at the beach BP was low was ridding bikes 05/24/23. sTOPPED lisinopril . Ringing clicking  in left ear )   Diagnosis and orders addressed:  1. Hypotension, unspecified hypotension type Resolved - CMP14+EGFR  2. Dizziness Improving - CMP14+EGFR  3. Essential hypertension At goal today - CMP14+EGFR   Will stop Lisinopril  Stay hydrated  Labs pending  Health Maintenance reviewed Diet and exercise encouraged  Follow up plan: Keep chronic follow up  Jannifer Rodney, FNP

## 2023-10-31 ENCOUNTER — Encounter: Payer: Self-pay | Admitting: Family

## 2023-10-31 DIAGNOSIS — Z1231 Encounter for screening mammogram for malignant neoplasm of breast: Secondary | ICD-10-CM

## 2023-11-02 ENCOUNTER — Ambulatory Visit: Payer: No Typology Code available for payment source | Admitting: Family Medicine

## 2023-11-02 ENCOUNTER — Encounter: Payer: Self-pay | Admitting: Family Medicine

## 2023-11-02 ENCOUNTER — Other Ambulatory Visit: Payer: 59

## 2023-11-02 VITALS — BP 136/88 | HR 81 | Temp 97.2°F | Ht 63.0 in | Wt 124.6 lb

## 2023-11-02 DIAGNOSIS — I491 Atrial premature depolarization: Secondary | ICD-10-CM

## 2023-11-02 DIAGNOSIS — R002 Palpitations: Secondary | ICD-10-CM | POA: Diagnosis not present

## 2023-11-02 NOTE — Progress Notes (Signed)
Subjective:  Patient ID: [name], female    DOB: 12/26/1965, 58 y.o.   MRN: [id number]  Patient Care Team: Junie Spencer, FNP as PCP - General (Family Medicine) Micki Riley, MD as Consulting Physician (Neurology)   Chief Complaint:  Palpitations (X 3 weeks )   HPI: [name] is a 58 y.o. female presenting on 11/02/2023 for Palpitations (X 3 weeks )   Discussed the use of AI scribe software for clinical note transcription with the patient, who gave verbal consent to proceed.  History of Present Illness   [name] is a 58 year old female who presents with heart palpitations. Her daughter, a travel nurse, has observed her symptoms.  She has been experiencing heart palpitations for several weeks, initially noticing them when lying down. The sensation is described as feeling her pulse or a fluttering in her chest. A similar episode occurred last September while riding bikes at [employer], which she attributed to dehydration. Her daughter has observed that her pulse is racing and sometimes skips, even when she is at rest.  The palpitations are more pronounced when she lies on her left side at night, prompting her to practice deep breathing and sleep with her head elevated to alleviate the symptoms. No chest pain, shortness of breath, or leg swelling is reported, but she does feel tired, which she attributes to caring for her mother who has dementia. Occasionally, she feels a little pressure in her chest, which she finds concerning enough to seek medical evaluation.  She is a caregiver for her mother, who has dementia, and has limited rest due to her caregiving responsibilities.          Relevant past medical, surgical, family, and social history reviewed and updated as indicated.  Allergies and medications reviewed and updated. Data reviewed: Chart in Epic.   Past Medical History:  Diagnosis Date   Carotid artery dissection (HCC)    Cervical  radiculopathy    Low back pain    Lumbar radiculopathy    Migraine headache    Neck pain     Past Surgical History:  Procedure Laterality Date   BACK SURGERY     CERVICAL FUSION     TOTAL ABDOMINAL HYSTERECTOMY      Social History   Socioeconomic History   Marital status: Divorced    Spouse name: Not on file   Number of children: Not on file   Years of education: Not on file   Highest education level: Not on file  Occupational History   Not on file  Tobacco Use   Smoking status: Never   Smokeless tobacco: Never  Vaping Use   Vaping status: Never Used  Substance and Sexual Activity   Alcohol use: No    Alcohol/week: 0.0 standard drinks of alcohol   Drug use: No   Sexual activity: Not on file  Other Topics Concern   Not on file  Social History Narrative   Not on file   Social Drivers of Health   Financial Resource Strain: Not on file  Food Insecurity: Not on file  Transportation Needs: Not on file  Physical Activity: Not on file  Stress: Not on file  Social Connections: Unknown (01/22/2022)   Received from [facility], [facility]   Social Network    Social Network: Not on file  Intimate Partner Violence: Unknown (12/14/2021)   Received from [facility], [facility]   HITS    Physically  Hurt: Not on file    Insult or Talk Down To: Not on file    Threaten Physical Harm: Not on file    Scream or Curse: Not on file    Outpatient Encounter Medications as of 11/02/2023  Medication Sig   Ascorbic Acid (VITAMIN C) 100 MG tablet Take 100 mg by mouth daily.   aspirin 81 MG chewable tablet Chew by mouth daily.   b complex vitamins capsule Take 1 capsule by mouth daily.   buPROPion (WELLBUTRIN XL) 150 MG 24 hr tablet Take 1 tablet (150 mg total) by mouth daily.   calcium citrate-vitamin D (CITRACAL+D) 315-200 MG-UNIT tablet Take 1 tablet by mouth 2 (two) times daily.   Coenzyme Q10 (COQ10) 100 MG CAPS Take by mouth.   fexofenadine (ALLEGRA) 60 MG tablet Take  60 mg by mouth 2 (two) times daily.   fluticasone (FLONASE) 50 MCG/ACT nasal spray Place 2 sprays into both nostrils as needed.   lisinopril (ZESTRIL) 10 MG tablet Take 10 mg by mouth daily.   SUMAtriptan (IMITREX) 50 MG tablet TAKE 1 TABLET AS NEEDED AS DIRECTED   No facility-administered encounter medications on file as of 11/02/2023.    No Known Allergies  Pertinent ROS per HPI, otherwise unremarkable      Objective:  BP 136/88   Pulse 81   Temp (!) 97.2 F (36.2 C)   Ht 5\' 3"  (1.6 m)   Wt 124 lb 9.6 oz (56.5 kg)   SpO2 99%   BMI 22.07 kg/m    Wt Readings from Last 3 Encounters:  11/02/23 124 lb 9.6 oz (56.5 kg)  06/07/23 124 lb (56.2 kg)  11/20/22 125 lb (56.7 kg)    Physical Exam Vitals and nursing note reviewed.  Constitutional:      General: She is not in acute distress.    Appearance: Normal appearance. She is well-developed, well-groomed and normal weight. She is not ill-appearing, toxic-appearing or diaphoretic.  HENT:     Head: Normocephalic and atraumatic.     Jaw: There is normal jaw occlusion.     Right Ear: Hearing normal.     Left Ear: Hearing normal.     Nose: Nose normal.     Mouth/Throat:     Lips: Pink.     Mouth: Mucous membranes are moist.     Pharynx: Oropharynx is clear. Uvula midline.  Eyes:     General: Lids are normal.     Extraocular Movements: Extraocular movements intact.     Conjunctiva/sclera: Conjunctivae normal.     Pupils: Pupils are equal, round, and reactive to light.  Neck:     Thyroid: No thyroid mass, thyromegaly or thyroid tenderness.     Vascular: No carotid bruit or JVD.     Trachea: Trachea and phonation normal.  Cardiovascular:     Rate and Rhythm: Normal rate and regular rhythm.     Chest Wall: PMI is not displaced.     Pulses: Normal pulses.     Heart sounds: Normal heart sounds. No murmur heard.    No friction rub. No gallop.  Pulmonary:     Effort: Pulmonary effort is normal. No respiratory distress.      Breath sounds: Normal breath sounds. No wheezing.  Abdominal:     General: Bowel sounds are normal.     Palpations: Abdomen is soft.  Musculoskeletal:        General: Normal range of motion.     Cervical back: Normal range of motion and  neck supple.     Right lower leg: No edema.     Left lower leg: No edema.  Lymphadenopathy:     Cervical: No cervical adenopathy.  Skin:    General: Skin is warm and dry.     Capillary Refill: Capillary refill takes less than 2 seconds.     Coloration: Skin is not cyanotic, jaundiced or pale.     Findings: No rash.  Neurological:     General: No focal deficit present.     Mental Status: She is alert and oriented to person, place, and time.     Sensory: Sensation is intact.     Motor: Motor function is intact.     Coordination: Coordination is intact.     Gait: Gait is intact.     Deep Tendon Reflexes: Reflexes are normal and symmetric.  Psychiatric:        Attention and Perception: Attention and perception normal.        Mood and Affect: Mood and affect normal.        Speech: Speech normal.        Behavior: Behavior normal. Behavior is cooperative.        Thought Content: Thought content normal.        Cognition and Memory: Cognition and memory normal.        Judgment: Judgment normal.     EKG: SR with PACs, 91, PR 138 ms, QT 348 ms, no acute ST-T changes. No significant changes from prior EKG. Kari Baars, FNP-C  Results for orders placed or performed in visit on 06/07/23  CMP14+EGFR   Collection Time: 06/07/23 10:25 AM  Result Value Ref Range   Glucose 73 70 - 99 mg/dL   BUN 10 6 - 24 mg/dL   Creatinine, Ser 1.61 0.57 - 1.00 mg/dL   eGFR 80 >09 UE/AVW/0.98   BUN/Creatinine Ratio 12 9 - 23   Sodium 140 134 - 144 mmol/L   Potassium 4.7 3.5 - 5.2 mmol/L   Chloride 103 96 - 106 mmol/L   CO2 25 20 - 29 mmol/L   Calcium 9.7 8.7 - 10.2 mg/dL   Total Protein 6.8 6.0 - 8.5 g/dL   Albumin 4.5 3.8 - 4.9 g/dL   Globulin, Total 2.3 1.5 -  4.5 g/dL   Bilirubin Total 0.5 0.0 - 1.2 mg/dL   Alkaline Phosphatase 92 44 - 121 IU/L   AST 19 0 - 40 IU/L   ALT 14 0 - 32 IU/L       Pertinent labs & imaging results that were available during my care of the patient were reviewed by me and considered in my medical decision making.  Assessment & Plan:  Mikaelyn was seen today for palpitations.  Diagnoses and all orders for this visit:  Palpitations -     EKG 12-Lead -     CMP14+EGFR -     CBC with Differential/Platelet -     Thyroid Panel With TSH -     LONG TERM MONITOR (3-14 DAYS); Future  PAC (premature atrial contraction) -     EKG 12-Lead -     CMP14+EGFR -     CBC with Differential/Platelet -     Thyroid Panel With TSH -     LONG TERM MONITOR (3-14 DAYS); Future     Assessment and Plan    Palpitations Reports heart racing and palpitations for several weeks, with episodes of skipped beats and fluttering sensations, particularly noticeable when lying on her left  side. EKG shows sinus rhythm with premature atrial complexes (PACs). Differential diagnosis includes atrial fibrillation, electrolyte imbalances, thyroid abnormalities, and anemia. No acute findings on EKG suggestive of a cardiovascular event. Discussed the importance of ruling out underlying causes and monitoring for arrhythmias. Informed her about the Zio patch, which will monitor for arrhythmias over two weeks. Explained that excessive caffeine and dehydration can contribute to palpitations. Discussed the need for lab work to check thyroid function, electrolytes, and complete blood count (CBC) to assess for anemia. Emphasized the importance of follow-up and potential referral to cardiology if concerning findings are noted. - Apply Zio patch for two weeks to monitor for arrhythmias - Order lab work to check thyroid function, electrolytes (including potassium and calcium), and complete blood count (CBC) to assess for anemia - Advise to avoid excessive caffeine intake  (limit to 200 mg/day) - Encourage adequate hydration - Follow up in 6-8 weeks or sooner if symptoms worsen - Refer to cardiologist if concerning findings on Zio patch  General Health Maintenance None - Coordinate follow-up with primary care provider in mid-March - Ensure Zio patch is mailed back by March 7th for timely review.          Continue all other maintenance medications.  Follow up plan: Return if symptoms worsen or fail to improve, for palpitations .   Continue healthy lifestyle choices, including diet (rich in fruits, vegetables, and lean proteins, and low in salt and simple carbohydrates) and exercise (at least 30 minutes of moderate physical activity daily).  Educational handout given for palpitations  The above assessment and management plan was discussed with the patient. The patient verbalized understanding of and has agreed to the management plan. Patient is aware to call the clinic if they develop any new symptoms or if symptoms persist or worsen. Patient is aware when to return to the clinic for a follow-up visit. Patient educated on when it is appropriate to go to the emergency department.   Kari Baars, FNP-C [facility] [phone]

## 2023-11-03 LAB — CMP14+EGFR
ALT: 17 [IU]/L (ref 0–32)
AST: 21 [IU]/L (ref 0–40)
Albumin: 4.6 g/dL (ref 3.8–4.9)
Alkaline Phosphatase: 88 [IU]/L (ref 44–121)
BUN/Creatinine Ratio: 21 (ref 9–23)
BUN: 15 mg/dL (ref 6–24)
Bilirubin Total: 0.6 mg/dL (ref 0.0–1.2)
CO2: 23 mmol/L (ref 20–29)
Calcium: 9.8 mg/dL (ref 8.7–10.2)
Chloride: 103 mmol/L (ref 96–106)
Creatinine, Ser: 0.72 mg/dL (ref 0.57–1.00)
Globulin, Total: 2.2 g/dL (ref 1.5–4.5)
Glucose: 92 mg/dL (ref 70–99)
Potassium: 4.7 mmol/L (ref 3.5–5.2)
Sodium: 139 mmol/L (ref 134–144)
Total Protein: 6.8 g/dL (ref 6.0–8.5)
eGFR: 97 mL/min/{1.73_m2} (ref >59)

## 2023-11-03 LAB — CBC WITH DIFFERENTIAL/PLATELET
Basophils Absolute: 0 10*3/uL (ref 0.0–0.2)
Basos: 1 %
EOS (ABSOLUTE): 0.1 10*3/uL (ref 0.0–0.4)
Eos: 2 %
Hematocrit: 44.6 % (ref 34.0–46.6)
Hemoglobin: 14.7 g/dL (ref 11.1–15.9)
Immature Grans (Abs): 0 10*3/uL (ref 0.0–0.1)
Immature Granulocytes: 0 %
Lymphocytes Absolute: 1.8 10*3/uL (ref 0.7–3.1)
Lymphs: 32 %
MCH: 32.7 pg (ref 26.6–33.0)
MCHC: 33 g/dL (ref 31.5–35.7)
MCV: 99 fL — ABNORMAL HIGH (ref 79–97)
Monocytes Absolute: 0.2 10*3/uL (ref 0.1–0.9)
Monocytes: 4 %
Neutrophils Absolute: 3.3 10*3/uL (ref 1.4–7.0)
Neutrophils: 61 %
Platelets: 276 10*3/uL (ref 150–450)
RBC: 4.5 x10E6/uL (ref 3.77–5.28)
RDW: 11.9 % (ref 11.7–15.4)
WBC: 5.5 10*3/uL (ref 3.4–10.8)

## 2023-11-03 LAB — THYROID PANEL WITH TSH
Free Thyroxine Index: 2.1 (ref 1.2–4.9)
T3 Uptake Ratio: 26 % (ref 24–39)
T4, Total: 8.1 ug/dL (ref 4.5–12.0)
TSH: 0.779 u[IU]/mL (ref 0.450–4.500)

## 2023-11-22 ENCOUNTER — Encounter: Payer: Self-pay | Admitting: Family Medicine

## 2023-11-22 NOTE — Addendum Note (Signed)
 Addended by: Sonny Masters on: 11/22/2023 10:07 AM   Modules accepted: Orders

## 2023-11-26 ENCOUNTER — Encounter: Payer: Self-pay | Admitting: Family

## 2023-11-26 ENCOUNTER — Ambulatory Visit (INDEPENDENT_AMBULATORY_CARE_PROVIDER_SITE_OTHER): Payer: 59 | Admitting: Family

## 2023-11-26 VITALS — BP 131/79 | HR 77 | Temp 97.7°F | Ht 63.0 in | Wt 119.4 lb

## 2023-11-26 DIAGNOSIS — F411 Generalized anxiety disorder: Secondary | ICD-10-CM | POA: Diagnosis not present

## 2023-11-26 DIAGNOSIS — Z0001 Encounter for general adult medical examination with abnormal findings: Secondary | ICD-10-CM

## 2023-11-26 DIAGNOSIS — I491 Atrial premature depolarization: Secondary | ICD-10-CM

## 2023-11-26 DIAGNOSIS — Z8679 Personal history of other diseases of the circulatory system: Secondary | ICD-10-CM

## 2023-11-26 DIAGNOSIS — F320 Major depressive disorder, single episode, mild: Secondary | ICD-10-CM | POA: Diagnosis not present

## 2023-11-26 DIAGNOSIS — Z0000 Encounter for general adult medical examination without abnormal findings: Secondary | ICD-10-CM

## 2023-11-26 DIAGNOSIS — M545 Low back pain, unspecified: Secondary | ICD-10-CM

## 2023-11-26 DIAGNOSIS — I10 Essential (primary) hypertension: Secondary | ICD-10-CM

## 2023-11-26 DIAGNOSIS — R002 Palpitations: Secondary | ICD-10-CM

## 2023-11-26 MED ORDER — LISINOPRIL 10 MG PO TABS: 10.0000 mg | ORAL_TABLET | Freq: Every day | ORAL | 3 refills | Status: DC

## 2023-11-26 MED ORDER — MELOXICAM 7.5 MG PO TABS: 7.5000 mg | ORAL_TABLET | Freq: Every day | ORAL | 2 refills | Status: AC

## 2023-11-26 MED ORDER — BUPROPION HCL ER (XL) 150 MG PO TB24: 150.0000 mg | ORAL_TABLET | Freq: Every day | ORAL | 3 refills | Status: AC

## 2023-11-26 NOTE — Patient Instructions (Signed)
Palpitations Palpitations are feelings that your heartbeat is irregular or is faster than normal. It may feel like your heart is fluttering or skipping a beat. Palpitations may be caused by many things, including smoking, caffeine, alcohol, stress, and certain medicines or drugs. Most causes of palpitations are not serious.  However, some palpitations can be a sign of a serious problem. Further tests and a thorough medical history will be done to find the cause of your palpitations. Your provider may order tests such as an ECG, labs, an echocardiogram, or an ambulatory continuous ECG monitor. Follow these instructions at home: Pay attention to any changes in your symptoms. Let your health care provider know about them. Take these actions to help manage your symptoms: Eating and drinking Follow instructions from your health care provider about eating or drinking restrictions. You may need to avoid foods and drinks that may cause palpitations. These may include: Caffeinated coffee, tea, soft drinks, and energy drinks. Chocolate. Alcohol. Diet pills. Lifestyle     Take steps to reduce your stress and anxiety. Things that can help you relax include: Yoga. Mind-body activities, such as deep breathing, meditation, or using words and images to create positive thoughts (guided imagery). Physical activity, such as swimming, jogging, or walking. Tell your health care provider if your palpitations increase with activity. If you have chest pain or shortness of breath with activity, do not continue the activity until you are seen by your health care provider. Biofeedback. This is a method that helps you learn to use your mind to control things in your body, such as your heartbeat. Get plenty of rest and sleep. Keep a regular bed time. Do not use drugs, including cocaine or ecstasy. Do not use marijuana. Do not use any products that contain nicotine or tobacco. These products include cigarettes, chewing  tobacco, and vaping devices, such as e-cigarettes. If you need help quitting, ask your health care provider. General instructions Take over-the-counter and prescription medicines only as told by your health care provider. Keep all follow-up visits. This is important. These may include visits for further testing if palpitations do not go away or get worse. Contact a health care provider if: You continue to have a fast or irregular heartbeat for a long period of time. You notice that your palpitations occur more often. Get help right away if: You have chest pain or shortness of breath. You have a severe headache. You feel dizzy or you faint. These symptoms may represent a serious problem that is an emergency. Do not wait to see if the symptoms will go away. Get medical help right away. Call your local emergency services (911 in the [place] Do not drive yourself to the hospital. Summary Palpitations are feelings that your heartbeat is irregular or is faster than normal. It may feel like your heart is fluttering or skipping a beat. Palpitations may be caused by many things, including smoking, caffeine, alcohol, stress, certain medicines, and drugs. Further tests and a thorough medical history may be done to find the cause of your palpitations. Get help right away if you faint or have chest pain, shortness of breath, severe headache, or dizziness. This information is not intended to replace advice given to you by your health care provider. Make sure you discuss any questions you have with your health care provider. Document Revised: 01/19/2021 Document Reviewed: 01/19/2021 Elsevier Patient Education  2024 [employer].

## 2023-11-26 NOTE — Progress Notes (Signed)
 Subjective:    Patient ID: [name], female    DOB: 11/05/1965, 58 y.o.   MRN: [id number]  Chief Complaint  Patient presents with   Annual Exam   PT presents to the office today for CPE without pap and chronic follow up. She has a hx of carotid arthery dissection.    She is complaining of palpitations and PAC. She was seen on 11/02/23. She had EKG, ZIO and normal TSH, CMP, and CBC. She has a referral to pending to Cardiologists.   She is having a great deal of stress caring for her mother with dementia. She is on hospice.  Hypertension This is a chronic problem. The current episode started more than 1 year ago. The problem has been resolved since onset. The problem is controlled. Associated symptoms include anxiety, malaise/fatigue and palpitations. Pertinent negatives include no peripheral edema or shortness of breath. Risk factors for coronary artery disease include sedentary lifestyle. The current treatment provides moderate improvement. There is no history of heart failure.  Anxiety Presents for follow-up visit. Symptoms include depressed mood, excessive worry, irritability, nervous/anxious behavior and palpitations. Patient reports no hyperventilation or shortness of breath. Symptoms occur occasionally. The severity of symptoms is mild.    Depression        This is a chronic problem.  The current episode started more than 1 year ago.   Associated symptoms include no helplessness, no hopelessness and not sad.  Past medical history includes anxiety.   Palpitations  This is a recurrent problem. The current episode started more than 1 month ago. The problem occurs intermittently. The problem has been waxing and waning. The symptoms are aggravated by stress. Associated symptoms include anxiety and malaise/fatigue. Pertinent negatives include no shortness of breath.       Review of Systems  Constitutional:  Positive for irritability and malaise/fatigue.  Respiratory:   Negative for shortness of breath.   Cardiovascular:  Positive for palpitations.  Psychiatric/Behavioral:  The patient is nervous/anxious.   All other systems reviewed and are negative.  Family History  Problem Relation Age of Onset   Hypertension Mother    Hypertension Father    CAD Father    Stroke Father    Heart murmur Sister    Heart failure Maternal Grandmother    Stroke Paternal Grandfather    Breast cancer Neg Hx    Social History   Socioeconomic History   Marital status: Divorced    Spouse name: Not on file   Number of children: Not on file   Years of education: Not on file   Highest education level: Not on file  Occupational History   Not on file  Tobacco Use   Smoking status: Never   Smokeless tobacco: Never  Vaping Use   Vaping status: Never Used  Substance and Sexual Activity   Alcohol use: No    Alcohol/week: 0.0 standard drinks of alcohol   Drug use: No   Sexual activity: Not on file  Other Topics Concern   Not on file  Social History Narrative   Not on file   Social Drivers of Health   Financial Resource Strain: Not on file  Food Insecurity: Not on file  Transportation Needs: Not on file  Physical Activity: Not on file  Stress: Not on file  Social Connections: Unknown (01/22/2022)   [facility] from [facility], [facility]   Social Network    Social Network: Not on file       Objective:  Physical Exam Vitals reviewed.  Constitutional:      General: She is not in acute distress.    Appearance: She is well-developed.  HENT:     Head: Normocephalic and atraumatic.     Right Ear: Tympanic membrane normal.     Left Ear: Tympanic membrane normal.  Eyes:     Pupils: Pupils are equal, round, and reactive to light.  Neck:     Thyroid: No thyromegaly.  Cardiovascular:     Rate and Rhythm: Normal rate and regular rhythm.     Heart sounds: Normal heart sounds. No murmur heard. Pulmonary:     Effort: Pulmonary effort is normal. No  respiratory distress.     Breath sounds: Normal breath sounds. No wheezing.  Abdominal:     General: Bowel sounds are normal. There is no distension.     Palpations: Abdomen is soft.     Tenderness: There is no abdominal tenderness.  Musculoskeletal:        General: No tenderness. Normal range of motion.     Cervical back: Normal range of motion and neck supple.  Skin:    General: Skin is warm and dry.  Neurological:     Mental Status: She is alert and oriented to person, place, and time.     Cranial Nerves: No cranial nerve deficit.     Deep Tendon Reflexes: Reflexes are normal and symmetric.  Psychiatric:        Behavior: Behavior normal.        Thought Content: Thought content normal.        Judgment: Judgment normal.       BP 131/79   Pulse 77   Temp 97.7 F (36.5 C) (Temporal)   Ht 5\' 3"  (1.6 m)   Wt 119 lb 6.4 oz (54.2 kg)   SpO2 97%   BMI 21.15 kg/m      Assessment & Plan:  [name] comes in today with chief complaint of Annual Exam   Diagnosis and orders addressed:  1. Annual physical exam (Primary)  2. Depression, major, single episode, mild (HCC) - buPROPion (WELLBUTRIN XL) 150 MG 24 hr tablet; Take 1 tablet (150 mg total) by mouth daily.  Dispense: 90 tablet; Refill: 3  3. GAD (generalized anxiety disorder) - buPROPion (WELLBUTRIN XL) 150 MG 24 hr tablet; Take 1 tablet (150 mg total) by mouth daily.  Dispense: 90 tablet; Refill: 3  4. History of carotid artery dissection  5. Palpitations - Ambulatory referral to Cardiology  6. PAC (premature atrial contraction)  - Ambulatory referral to Cardiology  7. Acute bilateral low back pain without sciatica - meloxicam (MOBIC) 7.5 MG tablet; Take 1 tablet (7.5 mg total) by mouth daily.  Dispense: 90 tablet; Refill: 2  8. Primary hypertension - lisinopril (ZESTRIL) 10 MG tablet; Take 1 tablet (10 mg total) by mouth daily.  Dispense: 90 tablet; Refill: 3   Labs reviewed from 11/02/23 Continue  current  Cardiologists pending  Stress management  Low carb diet Health Maintenance reviewed Diet and exercise encouraged  Follow up plan: 1 year    Jannifer Rodney, FNP

## 2023-12-24 ENCOUNTER — Ambulatory Visit
Admission: RE | Admit: 2023-12-24 | Discharge: 2023-12-24 | Disposition: A | Discharge status: Home / Self Care | Source: Ambulatory Visit | Attending: Family | Admitting: Family

## 2023-12-24 ENCOUNTER — Other Ambulatory Visit: Payer: Self-pay | Admitting: Family

## 2023-12-24 DIAGNOSIS — Z1231 Encounter for screening mammogram for malignant neoplasm of breast: Secondary | ICD-10-CM

## 2024-02-15 NOTE — Progress Notes (Signed)
 Referring-Christy Hawks FNP Reason for referral-palpitations  HPI: 58 year old female for evaluation of palpitations at request of Tommas Fragmin FNP.  Also with history of carotid artery dissection.  Laboratories February 2025 showed normal TSH and total T4; hemoglobin 14.7; potassium 4.7.  Monitor March 2025 showed sinus rhythm with 5 beats of SVT, occasional PACs and PVCs, rare couplet.  Patient states that approximately 1 year ago after riding her bike in the hot weather she had an episode of severe weakness and dizziness requiring her to lay down.  She attributed this to dehydration.  Since January she has noticed palpitations.  They are described as a flutter and a skip.  She did have symptoms while her monitor was in place.  She denies increased dyspnea on exertion, orthopnea, PND or pedal edema.  She has had some chest tightness occasionally with activities.  We are now asked to further evaluate.  Current Outpatient Medications  Medication Sig Dispense Refill   aspirin  81 MG chewable tablet Chew by mouth daily.     b complex vitamins capsule Take 1 capsule by mouth daily.     buPROPion  (WELLBUTRIN  XL) 150 MG 24 hr tablet Take 1 tablet (150 mg total) by mouth daily. 90 tablet 3   Coenzyme Q10 (COQ10) 100 MG CAPS Take by mouth.     fexofenadine (ALLEGRA) 60 MG tablet Take 60 mg by mouth 2 (two) times daily. (Patient taking differently: Take 60 mg by mouth as needed for allergies.)     lisinopril  (ZESTRIL ) 10 MG tablet Take 1 tablet (10 mg total) by mouth daily. 90 tablet 3   meloxicam  (MOBIC ) 7.5 MG tablet Take 1 tablet (7.5 mg total) by mouth daily. (Patient taking differently: Take 7.5 mg by mouth as needed.) 90 tablet 2   Omega-3 Fatty Acids (OMEGA 3 PO) Take 1,600 mg by mouth daily at 6 (six) AM.     OVER THE COUNTER MEDICATION Take 1 capsule by mouth daily at 6 (six) AM. D3,K2 and Magnesium     Ascorbic Acid (VITAMIN C) 100 MG tablet Take 100 mg by mouth daily. (Patient not taking:  Reported on 02/26/2024)     calcium citrate-vitamin D  (CITRACAL+D) 315-200 MG-UNIT tablet Take 1 tablet by mouth 2 (two) times daily. (Patient not taking: Reported on 02/26/2024)     fluticasone  (FLONASE ) 50 MCG/ACT nasal spray Place 2 sprays into both nostrils as needed. (Patient not taking: Reported on 02/26/2024)     SUMAtriptan  (IMITREX ) 50 MG tablet TAKE 1 TABLET AS NEEDED AS DIRECTED (Patient not taking: Reported on 02/26/2024) 27 tablet 2   No current facility-administered medications for this visit.    No Known Allergies   Past Medical History:  Diagnosis Date   Carotid artery dissection (HCC)    Cervical radiculopathy    Hypertension    Low back pain    Lumbar radiculopathy    Migraine headache    Neck pain     Past Surgical History:  Procedure Laterality Date   BACK SURGERY     CERVICAL FUSION     TOTAL ABDOMINAL HYSTERECTOMY      Social History   Socioeconomic History   Marital status: Divorced    Spouse name: Not on file   Number of children: 2   Years of education: Not on file   Highest education level: Not on file  Occupational History   Not on file  Tobacco Use   Smoking status: Never   Smokeless tobacco: Never  Vaping  Use   Vaping status: Never Used  Substance and Sexual Activity   Alcohol use: Yes    Comment: Occasional   Drug use: No   Sexual activity: Not on file  Other Topics Concern   Not on file  Social History Narrative   Not on file   Social Drivers of Health   Financial Resource Strain: Not on file  Food Insecurity: Not on file  Transportation Needs: Not on file  Physical Activity: Not on file  Stress: Not on file  Social Connections: Unknown (01/22/2022)   Received [facility]   Social Network    Social Network: Not on file  Intimate Partner Violence: Unknown (12/14/2021)   Received from [facility]   HITS    Physically Hurt: Not on file    Insult or Talk Down To: Not on file    Threaten Physical Harm: Not on file     Scream or Curse: Not on file    Family History  Problem Relation Age of Onset   Hypertension Mother    Hypertension Father    CAD Father    Stroke Father    Heart murmur Sister    Heart failure Maternal Grandmother    Stroke Paternal Grandfather    Breast cancer Neg Hx     ROS: no fevers or chills, productive cough, hemoptysis, dysphasia, odynophagia, melena, hematochezia, dysuria, hematuria, rash, seizure activity, orthopnea, PND, pedal edema, claudication. Remaining systems are negative.  Physical Exam:   Blood pressure 114/72, pulse 93, height 5' 2 (1.575 m), weight 122 lb 6.4 oz (55.5 kg), SpO2 99%.  General:  Well developed/well nourished in NAD Skin warm/dry Patient not depressed No peripheral clubbing Back-normal HEENT-normal/normal eyelids Neck supple/normal carotid upstroke bilaterally; no bruits; no JVD; no thyromegaly chest - CTA/ normal expansion CV - RRR/normal S1 and S2; no murmurs, rubs or gallops;  PMI nondisplaced Abdomen -NT/ND, no HSM, no mass, + bowel sounds, no bruit 2+ femoral pulses, no bruits Ext-no edema, chords, 2+ DP Neuro-grossly nonfocal  ECG -November 02, 2023-normal sinus rhythm with PAC.  Personally reviewed  A/P  1 palpitations-patient describes palpitations and they were noted at time of monitor above.  Her monitor showed brief run of SVT, PACs, PVCs and rare couplet.  I will add Toprol 25 mg nightly.  Schedule echocardiogram to assess LV function.  2 hypertension-given that we are adding Toprol I will discontinue lisinopril .  Follow blood pressure and adjust medications as needed.  3 chest tightness-symptoms are atypical.  Will arrange coronary CTA to rule out obstructive coronary disease.  Alexandria Angel, MD

## 2024-02-26 ENCOUNTER — Encounter: Payer: Self-pay | Admitting: Cardiology

## 2024-02-26 ENCOUNTER — Ambulatory Visit: Attending: Cardiology | Admitting: Cardiology

## 2024-02-26 VITALS — BP 114/72 | HR 93 | Ht 62.0 in | Wt 122.4 lb

## 2024-02-26 DIAGNOSIS — R072 Precordial pain: Secondary | ICD-10-CM

## 2024-02-26 DIAGNOSIS — R002 Palpitations: Secondary | ICD-10-CM

## 2024-02-26 MED ORDER — METOPROLOL SUCCINATE ER 25 MG PO TB24: 25.0000 mg | ORAL_TABLET | Freq: Every day | ORAL | 3 refills | Status: AC

## 2024-02-26 MED ORDER — METOPROLOL TARTRATE 100 MG PO TABS: ORAL_TABLET | ORAL | 0 refills | Status: DC

## 2024-02-26 NOTE — Patient Instructions (Signed)
 STOP LISINOPRIL   START METOPROLOL SUCC ER 25 MG ONCE DAILY AT BEDTIME  Testing/Procedures:  Your physician has requested that you have an echocardiogram. Echocardiography is a painless test that uses sound waves to create images of your heart. It provides your doctor with information about the size and shape of your heart and how well your heart's chambers and valves are working. This procedure takes approximately one hour. There are no restrictions for this procedure. Please do NOT wear cologne, perfume, aftershave, or lotions (deodorant is allowed). Please arrive 15 minutes prior to your appointment time.  Please note: We ask at that you not bring children with you during ultrasound (echo/ vascular) testing. Due to room size and safety concerns, children are not allowed in the ultrasound rooms during exams. Our front office staff cannot provide observation of children in our lobby area while testing is being conducted. An adult accompanying a patient to their appointment will only be allowed in the ultrasound room at the discretion of the ultrasound technician under special circumstances. We apologize for any inconvenience. [address]     Your cardiac CT will be scheduled at one of the below locations:    Jeralene Mom. [facility] and Vascular Tower [address]  [place], [place] [address] Opening January 07, 2024    All radiology patients and guests should use entrance C2 at [facility], accessed from [facility], even though the hospital's physical address listed is [address]  If scheduled at the Heart and Vascular Tower at [address], please enter the parking lot using the [address] entrance and use the FREE valet service at the patient drop-off area. Enter the buidling and check-in with registration on the main floor.  Please follow these instructions carefully (unless otherwise directed):  An IV will be required for this test and  Nitroglycerin will be given.     On the Night Before the Test: Be sure to Drink plenty of water. Do not consume any caffeinated/decaffeinated beverages or chocolate 12 hours prior to your test. Do not take any antihistamines 12 hours prior to your test.   On the Day of the Test: Drink plenty of water until 1 hour prior to the test. Do not eat any food 1 hour prior to test. You may take your regular medications prior to the test.  Take metoprolol (Lopressor) 100 MG two hours prior to test. FEMALES- please wear underwire-free bra if available, avoid dresses & tight clothing      After the Test: Drink plenty of water. After receiving IV contrast, you may experience a mild flushed feeling. This is normal. On occasion, you may experience a mild rash up to 24 hours after the test. This is not dangerous. If this occurs, you can take Benadryl  25 mg, Zyrtec, Claritin, or Allegra and increase your fluid intake. (Patients taking Tikosyn should avoid Benadryl , and may take Zyrtec, Claritin, or Allegra) If you experience trouble breathing, this can be serious. If it is severe call 911 IMMEDIATELY. If it is mild, please call our office.  We will call to schedule your test 2-4 weeks out understanding that some insurance companies will need an authorization prior to the service being performed.   For more information and frequently asked questions, please visit our website : [email or web address]  For non-scheduling related questions, please contact the cardiac imaging nurse navigator should you have any questions/concerns: Cardiac Imaging Nurse Navigators Direct Office Dial: [phone]   For scheduling needs, including cancellations and  rescheduling, please call [place], [phone].   Follow-Up: At [facility], you and your health needs are our priority.  As part of our continuing mission to provide you with exceptional heart care, our providers are all part of one team.   This team includes your primary Cardiologist (physician) and Advanced Practice Providers or APPs (Physician Assistants and Nurse Practitioners) who all work together to provide you with the care you need, when you need it.  Your next appointment:   12 month(s)  Provider:   Alexandria Angel MD

## 2024-03-17 ENCOUNTER — Ambulatory Visit (HOSPITAL_COMMUNITY)

## 2024-04-09 ENCOUNTER — Encounter (HOSPITAL_COMMUNITY): Payer: Self-pay

## 2024-04-10 ENCOUNTER — Ambulatory Visit: Payer: Self-pay | Admitting: Cardiology

## 2024-04-10 ENCOUNTER — Other Ambulatory Visit (HOSPITAL_COMMUNITY): Payer: Self-pay | Admitting: *Deleted

## 2024-04-10 ENCOUNTER — Ambulatory Visit (HOSPITAL_COMMUNITY)
Admission: RE | Admit: 2024-04-10 | Discharge: 2024-04-10 | Disposition: A | Discharge status: Home / Self Care | Payer: Self-pay | Source: Ambulatory Visit | Attending: Cardiology | Admitting: Cardiology

## 2024-04-10 DIAGNOSIS — R002 Palpitations: Secondary | ICD-10-CM | POA: Insufficient documentation

## 2024-04-10 DIAGNOSIS — I10 Essential (primary) hypertension: Secondary | ICD-10-CM

## 2024-04-10 LAB — ECHOCARDIOGRAM COMPLETE
Area-P 1/2: 5.32 cm2
S' Lateral: 2.7 cm

## 2024-04-10 MED ORDER — METOPROLOL TARTRATE 100 MG PO TABS: ORAL_TABLET | ORAL | 0 refills | Status: DC

## 2024-04-14 ENCOUNTER — Ambulatory Visit (HOSPITAL_COMMUNITY)
Admission: RE | Admit: 2024-04-14 | Payer: Self-pay | Source: Ambulatory Visit | Attending: Cardiology | Admitting: Cardiology

## 2024-04-14 ENCOUNTER — Encounter (HOSPITAL_COMMUNITY): Payer: Self-pay

## 2024-05-29 DIAGNOSIS — H10013 Acute follicular conjunctivitis, bilateral: Secondary | ICD-10-CM | POA: Diagnosis not present

## 2024-06-13 DIAGNOSIS — N3000 Acute cystitis without hematuria: Secondary | ICD-10-CM | POA: Diagnosis not present

## 2024-06-17 ENCOUNTER — Encounter (HOSPITAL_COMMUNITY): Payer: Self-pay

## 2024-06-21 DIAGNOSIS — H10013 Acute follicular conjunctivitis, bilateral: Secondary | ICD-10-CM | POA: Diagnosis not present

## 2024-07-15 ENCOUNTER — Ambulatory Visit: Admitting: Family

## 2024-07-15 ENCOUNTER — Encounter: Payer: Self-pay | Admitting: Family

## 2024-07-15 ENCOUNTER — Ambulatory Visit: Payer: Self-pay

## 2024-07-15 VITALS — BP 147/85 | HR 93 | Temp 98.7°F | Ht 62.0 in | Wt 126.6 lb

## 2024-07-15 DIAGNOSIS — J0110 Acute frontal sinusitis, unspecified: Secondary | ICD-10-CM

## 2024-07-15 MED ORDER — AMOXICILLIN-POT CLAVULANATE 875-125 MG PO TABS: 1.0000 | ORAL_TABLET | Freq: Two times a day (BID) | ORAL | 0 refills | Status: DC

## 2024-07-15 NOTE — Progress Notes (Signed)
 Subjective:    Patient ID: [name], female    DOB: [birth year], 58 y.o.   MRN: [id number]  Chief Complaint  Patient presents with   Cough    Week ago. Ears and dizziness. Fatigued congestion.    Cough This is a new problem. The current episode started 1 to 4 weeks ago. Associated symptoms include headaches and a sore throat. Pertinent negatives include no ear pain or shortness of breath.  Sinusitis This is a new problem. The current episode started 1 to 4 weeks ago. The problem is unchanged. There has been no fever. Her pain is at a severity of 4/10. The pain is mild. Associated symptoms include congestion, coughing, headaches, a hoarse voice, sinus pressure and a sore throat. Pertinent negatives include no ear pain or shortness of breath. Past treatments include oral decongestants. The treatment provided mild relief.      Review of Systems  HENT:  Positive for congestion, hoarse voice, sinus pressure and sore throat. Negative for ear pain.   Respiratory:  Positive for cough. Negative for shortness of breath.   Neurological:  Positive for headaches.  All other systems reviewed and are negative.   Social History   Socioeconomic History   Marital status: Divorced    Spouse name: Not on file   Number of children: 2   Years of education: Not on file   Highest education level: Not on file  Occupational History   Not on file  Tobacco Use   Smoking status: Never   Smokeless tobacco: Never  Vaping Use   Vaping status: Never Used  Substance and Sexual Activity   Alcohol use: Yes    Comment: Occasional   Drug use: No   Sexual activity: Not on file  Other Topics Concern   Not on file  Social History Narrative   Not on file   Social Drivers of Health   Financial Resource Strain: Not on file  Food Insecurity: Not on file  Transportation Needs: Not on file  Physical Activity: Not on file  Stress: Not on file  Social Connections: Unknown (01/22/2022)   Received  from [facility]   Social Network    Social Network: Not on file   Family History  Problem Relation Age of Onset   Hypertension Mother    Hypertension Father    CAD Father    Stroke Father    Heart murmur Sister    Heart failure Maternal Grandmother    Stroke Paternal Grandfather    Breast cancer Neg Hx         Objective:   Physical Exam Vitals reviewed.  Constitutional:      General: She is not in acute distress.    Appearance: She is well-developed.  HENT:     Head: Normocephalic and atraumatic.     Right Ear: Tympanic membrane normal.     Left Ear: Tympanic membrane normal.     Nose:     Right Sinus: Frontal sinus tenderness present.     Left Sinus: Frontal sinus tenderness present.  Eyes:     Pupils: Pupils are equal, round, and reactive to light.  Neck:     Thyroid : No thyromegaly.  Cardiovascular:     Rate and Rhythm: Normal rate and regular rhythm.     Heart sounds: Normal heart sounds. No murmur heard. Pulmonary:     Effort: Pulmonary effort is normal. No respiratory distress.     Breath sounds: Normal breath sounds. No wheezing.  Comments: Dry nonproductive cough Abdominal:     General: Bowel sounds are normal. There is no distension.     Palpations: Abdomen is soft.     Tenderness: There is no abdominal tenderness.  Musculoskeletal:        General: No tenderness. Normal range of motion.     Cervical back: Normal range of motion and neck supple.  Skin:    General: Skin is warm and dry.  Neurological:     Mental Status: She is alert and oriented to person, place, and time.     Cranial Nerves: No cranial nerve deficit.     Deep Tendon Reflexes: Reflexes are normal and symmetric.  Psychiatric:        Behavior: Behavior normal.        Thought Content: Thought content normal.        Judgment: Judgment normal.       BP (!) 147/85   Pulse 93   Temp 98.7 F (37.1 C) (Temporal)   Ht 5' 2 (1.575 m)   Wt 126 lb 9.6 oz (57.4 kg)   SpO2 95%    BMI 23.16 kg/m      Assessment & Plan:  [name] comes in today with chief complaint of Cough (Week ago. Ears and dizziness. Fatigued congestion.)   Diagnosis and orders addressed:  1. Acute non-recurrent frontal sinusitis (Primary) - Take meds as prescribed - Use a cool mist humidifier  -Use saline nose sprays frequently -Force fluids -For any cough or congestion  Use plain Mucinex- regular strength or max strength is fine -For fever or aces or pains- take tylenol  or ibuprofen . -Throat lozenges if help -Follow up if symptoms worsen or do not improve  - amoxicillin -clavulanate (AUGMENTIN ) 875-125 MG tablet; Take 1 tablet by mouth 2 (two) times daily.  Dispense: 14 tablet; Refill: 0     Bari Learn, FNP

## 2024-07-15 NOTE — Patient Instructions (Signed)

## 2024-07-15 NOTE — Telephone Encounter (Signed)
 Appt made.

## 2024-07-15 NOTE — Telephone Encounter (Signed)
 FYI Only or Action Required?: FYI only for provider: appointment scheduled on 07/15/2024.  Patient was last seen in primary care on 11/26/2023 by Lavell Bari LABOR, FNP.  Called Nurse Triage reporting Cough.  Symptoms began several days ago.  Interventions attempted: OTC medications: Mucinex, Allegra, Flonase .  Symptoms are: gradually worsening.  Triage Disposition: See HCP Within 4 Hours (Or PCP Triage)  Patient/caregiver understands and will follow disposition?: Yes        Copied from CRM #[id number]. Topic: Clinical - Red Word Triage >> Jul 15, 2024  8:01 AM Harlene ORN wrote: Red Word that prompted transfer to Nurse Triage: Sinus infection since last Wednesday; coughing getting worse; struggling to breathe in between coughs, blood in her mucus Reason for Disposition  [1] MILD difficulty breathing (e.g., minimal/no SOB at rest, SOB with walking, pulse < 100) AND [2] still present when not coughing  Answer Assessment - Initial Assessment Questions Using Allerga, mucinex  and flonase  for symptoms.   1. ONSET: When did the cough begin?      Last week Wednesday  2. SEVERITY: How bad is the cough today?      Mild during day and moderate at night  3. SPUTUM: Describe the color of your sputum (e.g., none, dry cough; clear, white, yellow, green)     Clear and a little yellow at times  4. HEMOPTYSIS: Are you coughing up any blood? If Yes, ask: How much? (e.g., flecks, streaks, tablespoons, etc.)     Denies but has flecks of blood when blowing nose  5. DIFFICULTY BREATHING: Are you having difficulty breathing? If Yes, ask: How bad is it? (e.g., mild, moderate, severe)      Moderate  6. FEVER: Do you have a fever? If Yes, ask: What is your temperature, how was it measured, and when did it start?     Denies  7. CARDIAC HISTORY: Do you have any history of heart disease? (e.g., heart attack, congestive heart failure)      Had to wear a heart monitor for palpitations  8.  LUNG HISTORY: Do you have any history of lung disease?  (e.g., pulmonary embolus, asthma, emphysema)     Denies  9. OTHER SYMPTOMS: Do you have any other symptoms? (e.g., runny nose, wheezing, chest pain)       Allergy symptoms, headache, chills, dry, burning sensation in throat  Protocols used: Cough - Acute Productive-A-AH

## 2024-08-02 ENCOUNTER — Ambulatory Visit: Admission: EM | Admit: 2024-08-02 | Discharge: 2024-08-02 | Disposition: A | Discharge status: Home / Self Care

## 2024-08-02 DIAGNOSIS — J302 Other seasonal allergic rhinitis: Secondary | ICD-10-CM | POA: Diagnosis present

## 2024-08-02 MED ORDER — FLUTICASONE PROPIONATE 50 MCG/ACT NA SUSP: 1.0000 | Freq: Every day | NASAL | 2 refills | Status: AC

## 2024-08-02 MED ORDER — FEXOFENADINE HCL 180 MG PO TABS: 180.0000 mg | ORAL_TABLET | Freq: Every day | ORAL | 1 refills | Status: AC

## 2024-08-02 NOTE — ED Provider Notes (Signed)
 [name]    CSN: [id number] Arrival date & time: 08/02/24  1157    HISTORY   Chief Complaint  Patient presents with   Cough   HPI [name] is a pleasant, 58 y.o. female who presents to urgent care today. Patient complains of a 4-week history of ear fullness, nonproductive cough which is worse at night and intermittent episodes of hoarseness of voice.  Patient denies fever, body aches, chills, nausea, vomiting, diarrhea, known sick contacts.  Vital signs are essentially normal on arrival today.  EMR reviewed by me.  Patient was seen 07/15/2024 by her primary care nurse practitioner for complaint of eight 1 to 4-week history of cough associated with headache and sore throat but not associated with ear pain.  Patient also complained of 1 to 4 weeks of sinusitis, complaining of pain that she rated 4 out of 10.  Patient stated that time that she had been taking oral decongestant which provided her with mild relief.  Patient denies shortness of breath.  Patient was treated with a 7-day course of Augmentin  and advised to take Mucinex.  Patient seems to have similar symptoms every fall which are, regrettably, almost always unnecessarily treated with Augmentin  by her primary care nurse practitioner.  The history is provided by the patient.      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 Authorizing Provider  amoxicillin -clavulanate (AUGMENTIN ) 875-125 MG tablet Take 1 tablet by mouth 2 (two) times daily. 07/15/24   Lavell Lye A, FNP  Ascorbic Acid (VITAMIN C) 100 MG tablet Take 100 mg by mouth daily.    [provider]  aspirin  81 MG chewable tablet Chew by mouth daily.    [provider]  b complex vitamins capsule Take 1 capsule by mouth daily.    [provider]  buPROPion  (WELLBUTRIN  XL) 150 MG 24 hr tablet Take 1 tablet (150 mg total) by mouth daily. 11/26/23   Lavell Lye A, FNP  calcium citrate-vitamin D  (CITRACAL+D) 315-200 MG-UNIT tablet Take 1 tablet by mouth 2 (two) times daily.    [provider]  Coenzyme Q10 (COQ10) 100 MG CAPS Take by mouth.    [provider]  fexofenadine  (ALLEGRA ) 60 MG tablet Take 60 mg by mouth 2 (two) times daily. Patient taking differently:  Take 60 mg by mouth as needed for allergies.    [provider]  fluticasone  (FLONASE ) 50 MCG/ACT nasal spray Place 2 sprays into both nostrils as needed.    [provider]  meloxicam  (MOBIC ) 7.5 MG tablet Take 1 tablet (7.5 mg total) by mouth daily. Patient taking differently: Take 7.5 mg by mouth as needed. 11/26/23   Lavell Bari LABOR, FNP  metoprolol  succinate (TOPROL  XL) 25 MG 24 hr tablet Take 1 tablet (25 mg total) by mouth at bedtime. 02/26/24   Pietro Redell RAMAN, MD  metoprolol  tartrate (LOPRESSOR ) 100 MG tablet TAKE 2 HOURS PRIOR TO CT SCAN 04/10/24   Pietro Redell RAMAN, MD  Omega-3 Fatty Acids (OMEGA 3 PO) Take 1,600 mg by mouth daily at 6 (six) AM.    [provider]  OVER THE COUNTER MEDICATION Take 1 capsule by mouth daily at 6 (six) AM. D3,K2 and Magnesium    [provider]  SUMAtriptan   (IMITREX ) 50 MG tablet TAKE 1 TABLET AS NEEDED AS DIRECTED 10/28/20   Lavell Bari LABOR, FNP    Family History Family History  Problem Relation Age of Onset   Hypertension Mother    Hypertension Father    CAD Father    Stroke Father    Heart murmur Sister    Heart failure Maternal Grandmother    Stroke Paternal Grandfather    Breast cancer Neg Hx    Social History Social History   Tobacco Use   Smoking status: Never    Passive exposure: Never   Smokeless tobacco: Never  Vaping Use   Vaping status: Never Used  Substance Use Topics   Alcohol use: Yes    Comment: Occasional   Drug use: No   Allergies   Patient has no known allergies.  Review of Systems Review of Systems Pertinent findings revealed after performing a 14 point review of systems has been noted in the history of present illness.  Physical Exam Vital Signs BP (!) 124/92 (BP Location: Right Arm)   Pulse 85   Temp 97.9 F (36.6 C) (Oral)   Resp 18   SpO2 97%   No data found.  Physical Exam Vitals and nursing note reviewed.  Constitutional:      General: She is awake. She is not in acute distress.    Appearance: Normal appearance. She is well-developed and well-groomed. She is not ill-appearing.  HENT:     Head: Normocephalic and atraumatic.     Salivary Glands: Right salivary gland is not diffusely enlarged or tender. Left salivary gland is not diffusely enlarged or tender.     Right Ear: Hearing, ear canal and external ear normal. A middle ear effusion is present. Tympanic membrane is bulging. Tympanic membrane is not injected or erythematous.     Left Ear: Hearing, ear canal and external ear normal. A middle ear effusion is present. Tympanic membrane is bulging. Tympanic membrane is not injected or erythematous.     Ears:     Comments: Bilateral EACs normal, both TMs bulging with clear fluid    Nose: Rhinorrhea present. No nasal deformity, septal deviation, signs of injury, nasal tenderness, mucosal  edema or congestion. Rhinorrhea is clear.     Right Nostril: Occlusion present. No foreign body, epistaxis or septal hematoma.     Left Nostril: Occlusion present. No foreign body, epistaxis or septal hematoma.     Right Turbinates: Not enlarged, swollen or pale.     Left Turbinates: Not enlarged, swollen or pale.  Right Sinus: No maxillary sinus tenderness or frontal sinus tenderness.     Left Sinus: No maxillary sinus tenderness or frontal sinus tenderness.     Mouth/Throat:     Lips: Pink. No lesions.     Mouth: Mucous membranes are moist. No oral lesions.     Tongue: No lesions. Tongue does not deviate from midline.     Palate: No mass and lesions.     Pharynx: Oropharynx is clear. Uvula midline. Postnasal drip present. No pharyngeal swelling, oropharyngeal exudate, posterior oropharyngeal erythema or uvula swelling.     Tonsils: No tonsillar exudate. 0 on the right. 0 on the left.     Comments: Postnasal drip Eyes:     General: Lids are normal.        Right eye: No discharge.        Left eye: No discharge.     Conjunctiva/sclera: Conjunctivae normal.     Right eye: Right conjunctiva is not injected.     Left eye: Left conjunctiva is not injected.  Neck:     Trachea: Trachea and phonation normal.  Cardiovascular:     Rate and Rhythm: Normal rate and regular rhythm.  Pulmonary:     Effort: Pulmonary effort is normal.     Breath sounds: Normal breath sounds and air entry. No wheezing, rhonchi or rales.  Chest:     Chest wall: No tenderness.  Musculoskeletal:        General: Normal range of motion.     Cervical back: Full passive range of motion without pain, normal range of motion and neck supple. Normal range of motion.  Lymphadenopathy:     Cervical: No cervical adenopathy.  Skin:    General: Skin is warm and dry.     Findings: No erythema or rash.  Neurological:     General: No focal deficit present.     Mental Status: She is alert and oriented to person, place, and  time. Mental status is at baseline.  Psychiatric:        Attention and Perception: Attention and perception normal.        Mood and Affect: Mood and affect normal.        Speech: Speech normal.        Behavior: Behavior normal. Behavior is cooperative.        Thought Content: Thought content normal.     Visual Acuity Right Eye Distance:   Left Eye Distance:   Bilateral Distance:    Right Eye Near:   Left Eye Near:    Bilateral Near:     UC Couse / Diagnostics / Procedures:     Radiology No results found.  Procedures Procedures (including critical care time) EKG  Pending results:  Labs Reviewed - No data to display  Medications Ordered in UC: Medications - No data to display  UC Diagnoses / Final Clinical Impressions(s)   I have reviewed the triage vital signs and the nursing notes.  Pertinent labs & imaging results that were available during my care of the patient were reviewed by me and considered in my medical decision making (see chart for details).    Final diagnoses:  Seasonal allergic rhinitis, unspecified trigger   After examination was completed, patient states she takes fexofenadine  60 mg 1-2 times a week and has Flonase  nasal spray at home which she has not been using.  Patient encouraged to use both daily, not as needed, during the fall to allergy season due to physical exam  findings and seasonal history of similar complaints.  No indication for viral, bacterial testing or any other treatment at this time.  Return precautions advised.  Please see discharge instructions below for details of plan of care as provided to patient. ED Prescriptions     Medication Sig Dispense Auth. Provider   fexofenadine  (ALLEGRA ) 180 MG tablet Take 1 tablet (180 mg total) by mouth daily. 90 tablet Joesph Shaver Scales, PA-C   fluticasone  (FLONASE ) 50 MCG/ACT nasal spray Place 1 spray into both nostrils daily. Begin by using 2 sprays in each nare daily for 3 to 5 days, then  decrease to 1 spray in each nare daily. 15.8 mL Joesph Shaver Scales, PA-C      PDMP not reviewed this encounter.    Discharge Instructions      Your symptoms and my physical exam findings are concerning for exacerbation of your underlying allergies.                   To avoid catching frequent respiratory infections, having skin reactions, dealing with eye irritation, losing sleep, missing work, etc., due to uncontrolled allergies, it is important that you begin/continue your allergy regimen and are consistent with taking your meds exactly as prescribed.               Frequent of antibiotics can increase you risk of diarrhea, C. difficile infection, allergic reactions such as anaphylaxis, Stevens-Johnson syndrome, drug related rashes, yeast infections, systemic lupus.  Frequent use of steroids can cause elevated blood sugar levels, elevated blood pressure, because adrenal dysfunction, weight gain, sleeplessness and anxiety  Please read below to learn more about the medications, dosages and frequencies that I recommend to help alleviate your symptoms and to get you feeling better soon:                      Allegra  (fexofenadine ): This is an excellent second-generation antihistamine that helps to reduce respiratory inflammatory response to environmental allergens.  This medication is not known to cause daytime sleepiness so it can be taken in the daytime.  If you find that it does make you sleepy, please feel free to take it at bedtime.                               Flonase  (fluticasone ): This is a steroid nasal spray that used once daily, 1 spray in each nare.  This works best when used on a daily basis. This medication does not work well if it is only used when you think you need it.  After 3 to 5 days of use, you will notice significant reduction of the inflammation and mucus production that is currently being caused by exposure to allergens, whether seasonal or environmental.  The most  common side effect of this medication is nosebleeds.  If you experience a nosebleed, please discontinue use for 1 week, then feel free to resume.  If you find that your insurance will not pay for this medication, please consider a different nasal steroids such as Nasonex (mometasone), or Nasacort (triamcinolone).       Both these medications are safe to take daily and do not affect your blood pressure, heart rate, heart rhythm, renal function, adrenal function, cortisol levels or blood sugar levels.  They also will stop your daughter from worrying about your having a cough.       Thank you for visiting [facility] today.  We appreciate the opportunity to participate in your care.       Disposition Upon Discharge:  Condition: stable for discharge home  Patient presented with an acute illness with associated systemic symptoms and significant discomfort requiring urgent management. In my opinion, this is a condition that a prudent lay person (someone who possesses an average knowledge of health and medicine) may potentially expect to result in complications if not addressed urgently such as respiratory distress, impairment of bodily function or dysfunction of bodily organs.   Routine symptom specific, illness specific and/or disease specific instructions were discussed with the patient and/or caregiver at length.   As such, the patient has been evaluated and assessed, work-up was performed and treatment was provided in alignment with urgent care protocols and evidence based medicine.  Patient/parent/caregiver has been advised that the patient may require follow up for further testing and treatment if the symptoms continue in spite of treatment, as clinically indicated and appropriate.  Patient/parent/caregiver has been advised to return to the [facility] or PCP if no better; to PCP or the Emergency Department if new signs and symptoms develop, or if the current signs or symptoms continue to  change or worsen for further workup, evaluation and treatment as clinically indicated and appropriate  The patient will follow up with their current PCP if and as advised. If the patient does not currently have a PCP we will assist them in obtaining one.   The patient may need specialty follow up if the symptoms continue, in spite of conservative treatment and management, for further workup, evaluation, consultation and treatment as clinically indicated and appropriate.  Patient/parent/caregiver verbalized understanding and agreement of plan as discussed.  All questions were addressed during visit.  Please see discharge instructions below for further details of plan.  This office note has been dictated using Teaching laboratory technician.  Unfortunately, this method of dictation can sometimes lead to typographical or grammatical errors.  I apologize for your inconvenience in advance if this occurs.  Please do not hesitate to reach out to me if clarification is needed.      Joesph Shaver Scales, PA-C 08/02/24 1230

## 2024-08-02 NOTE — ED Triage Notes (Addendum)
 Patient states about 4 weeks ago she started having ear fullness and a cough that increased at night. She was seen about two weeks ago and she finished the Augmentin . Her symptoms have picked back up since then and she has been sneezing.   She has tried a OTC cough medication, [place], Elderberry, and Zinc .

## 2024-08-02 NOTE — Discharge Instructions (Signed)
 Your symptoms and my physical exam findings are concerning for exacerbation of your underlying allergies.                   To avoid catching frequent respiratory infections, having skin reactions, dealing with eye irritation, losing sleep, missing work, etc., due to uncontrolled allergies, it is important that you begin/continue your allergy regimen and are consistent with taking your meds exactly as prescribed.               Frequent of antibiotics can increase you risk of diarrhea, C. difficile infection, allergic reactions such as anaphylaxis, Stevens-Johnson syndrome, drug related rashes, yeast infections, systemic lupus.  Frequent use of steroids can cause elevated blood sugar levels, elevated blood pressure, because adrenal dysfunction, weight gain, sleeplessness and anxiety  Please read below to learn more about the medications, dosages and frequencies that I recommend to help alleviate your symptoms and to get you feeling better soon:                      Allegra  (fexofenadine ): This is an excellent second-generation antihistamine that helps to reduce respiratory inflammatory response to environmental allergens.  This medication is not known to cause daytime sleepiness so it can be taken in the daytime.  If you find that it does make you sleepy, please feel free to take it at bedtime.                               Flonase  (fluticasone ): This is a steroid nasal spray that used once daily, 1 spray in each nare.  This works best when used on a daily basis. This medication does not work well if it is only used when you think you need it.  After 3 to 5 days of use, you will notice significant reduction of the inflammation and mucus production that is currently being caused by exposure to allergens, whether seasonal or environmental.  The most common side effect of this medication is nosebleeds.  If you experience a nosebleed, please discontinue use for 1 week, then feel free to resume.  If you find  that your insurance will not pay for this medication, please consider a different nasal steroids such as Nasonex (mometasone), or Nasacort (triamcinolone).       Both these medications are safe to take daily and do not affect your blood pressure, heart rate, heart rhythm, renal function, adrenal function, cortisol levels or blood sugar levels.  They also will stop your daughter from worrying about your having a cough.       Thank you for visiting [facility] today.  We appreciate the opportunity to participate in your care.

## 2024-10-15 ENCOUNTER — Ambulatory Visit: Admitting: Family Medicine

## 2024-11-27 ENCOUNTER — Encounter: Payer: Self-pay | Admitting: Family

## 2024-11-27 ENCOUNTER — Encounter: Admitting: Family
